# Patient Record
Sex: Female | Born: 1955 | Race: White | Hispanic: No | Marital: Married | Smoking: Never smoker
Health system: Southern US, Community
[De-identification: ages and names within clinical notes are randomized; demographics above are authoritative.]

## PROBLEM LIST (undated history)

## (undated) DIAGNOSIS — T8859XA Other complications of anesthesia, initial encounter: Secondary | ICD-10-CM

## (undated) DIAGNOSIS — J45909 Unspecified asthma, uncomplicated: Secondary | ICD-10-CM

## (undated) DIAGNOSIS — T4145XA Adverse effect of unspecified anesthetic, initial encounter: Secondary | ICD-10-CM

## (undated) DIAGNOSIS — Z780 Asymptomatic menopausal state: Secondary | ICD-10-CM

## (undated) DIAGNOSIS — R6882 Decreased libido: Secondary | ICD-10-CM

## (undated) HISTORY — PX: KNEE ARTHROSCOPY: SHX127

## (undated) HISTORY — DX: Asymptomatic menopausal state: Z78.0

## (undated) HISTORY — DX: Decreased libido: R68.82

---
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
## 1987-06-19 HISTORY — PX: APPENDECTOMY: SHX54

## 2001-06-18 HISTORY — PX: BREAST BIOPSY: SHX20

## 2004-06-21 ENCOUNTER — Ambulatory Visit: Payer: Self-pay | Admitting: Internal Medicine

## 2006-03-08 ENCOUNTER — Ambulatory Visit: Payer: Self-pay

## 2007-10-20 ENCOUNTER — Ambulatory Visit: Payer: Self-pay | Admitting: Obstetrics and Gynecology

## 2007-10-27 ENCOUNTER — Other Ambulatory Visit: Payer: Self-pay

## 2007-10-27 ENCOUNTER — Ambulatory Visit: Payer: Self-pay | Admitting: Obstetrics and Gynecology

## 2008-06-22 ENCOUNTER — Ambulatory Visit: Payer: Self-pay

## 2009-09-28 ENCOUNTER — Ambulatory Visit: Payer: Self-pay

## 2010-10-02 ENCOUNTER — Ambulatory Visit: Payer: Self-pay

## 2011-03-23 ENCOUNTER — Ambulatory Visit: Payer: Self-pay | Admitting: General Practice

## 2012-01-22 ENCOUNTER — Ambulatory Visit: Payer: Self-pay | Admitting: Internal Medicine

## 2012-06-27 ENCOUNTER — Other Ambulatory Visit: Payer: Self-pay | Admitting: Obstetrics and Gynecology

## 2014-09-20 DIAGNOSIS — J45990 Exercise induced bronchospasm: Secondary | ICD-10-CM | POA: Insufficient documentation

## 2014-09-20 DIAGNOSIS — E663 Overweight: Secondary | ICD-10-CM

## 2014-09-22 ENCOUNTER — Encounter: Payer: Self-pay | Admitting: *Deleted

## 2015-02-25 ENCOUNTER — Telehealth: Payer: Self-pay | Admitting: Obstetrics and Gynecology

## 2015-02-25 NOTE — Telephone Encounter (Signed)
PT CALLED AND WOULD LIKE ANOTHER 3 MONTH REFILL ON HER B12 AND PHENTERMINE SENT TO [facility] ON [address].

## 2015-02-25 NOTE — Telephone Encounter (Signed)
DONE

## 2015-02-25 NOTE — Telephone Encounter (Signed)
Traci Drake would you please call pt and set up appt for refill thanks

## 2015-03-08 ENCOUNTER — Encounter: Payer: Self-pay | Admitting: Obstetrics and Gynecology

## 2015-03-08 ENCOUNTER — Ambulatory Visit (INDEPENDENT_AMBULATORY_CARE_PROVIDER_SITE_OTHER): Admitting: Obstetrics and Gynecology

## 2015-03-08 VITALS — BP 129/83 | HR 96 | Ht 66.0 in | Wt 145.0 lb

## 2015-03-08 DIAGNOSIS — Z79899 Other long term (current) drug therapy: Secondary | ICD-10-CM

## 2015-03-08 NOTE — Progress Notes (Signed)
Subjective:     Patient ID: SHAWNDA MAUNEY, female   DOB: July 18, 1955, 59 y.o.   MRN: [id number]  HPI Has been taking adipex and B12 for weight loss, down 26# and happy with current weight, BMI now 23.5  Review of Systems No complaints    Objective:   Physical Exam A&O x4 Well groomed female in no distress    Assessment:     Weight loss successful, normal BMI     Plan:     RX given for PRN use  RTC prn  Yolanda Bonine, CNM

## 2015-06-08 ENCOUNTER — Ambulatory Visit: Admitting: Obstetrics and Gynecology

## 2015-09-18 ENCOUNTER — Other Ambulatory Visit: Payer: Self-pay | Admitting: Obstetrics and Gynecology

## 2015-11-16 ENCOUNTER — Other Ambulatory Visit: Payer: Self-pay | Admitting: Obstetrics and Gynecology

## 2015-11-16 MED ORDER — CYANOCOBALAMIN 1000 MCG/ML IJ SOLN: 1000.0000 ug | Freq: Once | INTRAMUSCULAR | Status: DC

## 2016-06-19 ENCOUNTER — Ambulatory Visit: Payer: Self-pay | Admitting: Physician Assistant

## 2016-06-19 ENCOUNTER — Encounter: Payer: Self-pay | Admitting: Physician Assistant

## 2016-06-19 VITALS — BP 130/80 | HR 100 | Temp 98.8°F

## 2016-06-19 DIAGNOSIS — J0100 Acute maxillary sinusitis, unspecified: Secondary | ICD-10-CM

## 2016-06-19 MED ORDER — AMOXICILLIN 875 MG PO TABS: 875.0000 mg | ORAL_TABLET | Freq: Two times a day (BID) | ORAL | 0 refills | Status: DC

## 2016-06-19 MED ORDER — PREDNISONE 10 MG PO TABS: 30.0000 mg | ORAL_TABLET | Freq: Every day | ORAL | 0 refills | Status: DC

## 2016-06-19 NOTE — Progress Notes (Signed)
S: C/o headache and congestion for 6 days, no fever, chills, cp/sob, v/d; mucus is green and thick,  c/o of facial pressure  Using otc meds:   O: PE: vitals wnl, nad, perrl eomi, normocephalic, tms dull, nasal mucosa red and swollen, throat injected, neck supple no lymph, lungs c t a, cv rrr, neuro intact  A:  Acute sinusitis   P: drink fluids, continue regular meds , use otc meds of choice, return if not improving in 5 days, return earlier if worsening , amoxil, prednisone 30 mg x 3d

## 2016-11-16 HISTORY — PX: KNEE ARTHROPLASTY: SHX992

## 2016-12-06 ENCOUNTER — Ambulatory Visit: Attending: Orthopedic Surgery

## 2016-12-06 DIAGNOSIS — M25561 Pain in right knee: Secondary | ICD-10-CM | POA: Insufficient documentation

## 2016-12-06 DIAGNOSIS — M25661 Stiffness of right knee, not elsewhere classified: Secondary | ICD-10-CM | POA: Diagnosis present

## 2016-12-06 DIAGNOSIS — M6281 Muscle weakness (generalized): Secondary | ICD-10-CM | POA: Diagnosis present

## 2016-12-06 NOTE — Therapy (Signed)
[facility] PH[facility] AND SPORTS MEDICINE [address] [place], [place], [address] Phone: [phone]   Fax:  [phone]  Physical Therapy Evaluation  Patient Details  Name: Traci Drake MRN: [id number] Date of Birth: Mar 31, 1956 Referring Provider: [name]  Encounter Date: 12/06/2016      PT End of Session - 12/06/16 0809    Visit Number 1   Number of Visits 13   Date for PT Re-Evaluation 01/17/17   Authorization Type no g codes   PT Start Time 0810   PT Stop Time 0910   PT Time Calculation (min) 60 min   Activity Tolerance Patient tolerated treatment well   Behavior [facility] Therapy [facility] for tasks assessed/performed      Past Medical History:  Diagnosis Date  . Decreased libido without sexual dysfunction   . Menopause     Past Surgical History:  Procedure Laterality Date  . APPENDECTOMY  1989  . BREAST BIOPSY  2003    There were no vitals filed for this visit.       Subjective Assessment - 12/06/16 0805    Subjective R partial knee replacement   Pertinent History Pt underwent medial R partial [facility] with Dr. [name]. No post-op complications. She started PT POD#1. Staples removed yesterday at her 2 week follow-up appointment by a resident. Pt had HH PT for the last 2 weeks (3d/wk). Per pt they worked on stretching, walking, and strengthening exercises. Last range of motion measure was 94 degrees flexion, unable to recall extension but was told that it was "good". Next follow-up appointment is 01/02/17 with MD. Pt reports she had increased pain/stiffness last night and increased stiffness this morning. Denies chills, fever, night sweats, nausea, and vomiting. Swelling has been gradually decreasing in RLE.   Limitations Walking   How long can you walk comfortably? 45 minutes   Diagnostic tests Serial radiographs, no problems with components as reported by patient   Patient Stated Goals Decrease  pain and improve function. Get back to working in the yard   Currently in Pain? Yes   Pain Score 5   Worst: 9/10; Best: 3/10   Pain Location Knee   Pain Orientation Right   Pain Descriptors / Indicators Aching;Shooting   Pain Type Surgical pain   Pain Radiating Towards None   Pain Onset 1 to 4 weeks ago   Pain Frequency Constant   Aggravating Factors  Extended standing, extended walking, worsens as day progresses, struggles to get comfortable in bed   Pain Relieving Factors seated with foot propped up, ice, meds, rest   Multiple Pain Sites No            [facility] PT Assessment - 12/06/16 0001      Assessment   Medical Diagnosis R partial knee replacement   Referring Provider [name]   Onset Date/Surgical Date 11/20/16   Hand Dominance Right   Next MD Visit 01/02/17   Prior Therapy Home health PT 3d/wk     Precautions   Precautions Knee     Restrictions   Weight Bearing Restrictions No     Balance Screen   Has the patient fallen in the past 6 months No   Has the patient had a decrease in activity level because of a fear of falling?  Yes   Is the patient reluctant to leave their home because of a fear of falling?  No     Home Environment  Living Environment Private residence   Living Arrangements Spouse/significant other   Available Help at Discharge Family   Type of Home House   Home Access Stairs to enter   Entrance Stairs-Number of Steps 2   Entrance Stairs-Rails Left   Home Layout Multi-level;Able to live on main level with bedroom/bathroom   Alternate Level Stairs-Number of Steps 15   Alternate Level Stairs-Rails Right   Home Equipment Walker - 2 wheels;Cane - single point;Bedside commode   Additional Comments Has been living in guest room on main level     Prior Function   Level of Independence Independent   Vocation Part time employment   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 Otherwise no N/T in bilatearl LE     Posture/Postural Control   Posture Comments WNL     ROM / Strength   AROM / PROM / Strength AROM;PROM;Strength     AROM   Overall AROM Comments Hamstring length around 90 degrees bilaterally   AROM Assessment Site Knee   Right/Left Knee Right;Left   Right Knee Extension -2   Right Knee Flexion 98   Left Knee Extension 0   Left Knee Flexion 135     PROM   PROM Assessment Site Knee   Right/Left Knee Right   Right Knee Extension -2  Unable to tolerate added pressure   Right Knee Flexion 104  Pain limited   Left Knee Extension 0   Left Knee Flexion 135     Strength   Overall Strength Comments Pt struggles to achieve good quad contraction with quad sets on RLE   Strength Assessment Site Hip;Knee;Ankle   Right/Left Hip Right;Left   Right Hip Flexion 5/5   Right Hip Extension 4/5   Right Hip External Rotation  5/5   Right Hip Internal Rotation 5/5   Right Hip ABduction 4+/5   Right Hip ADduction 4-/5   Left Hip Flexion 5/5   Left Hip Extension 4/5   Left Hip External Rotation 5/5   Left Hip Internal Rotation 5/5   Left Hip ABduction 4+/5   Left Hip ADduction 4+/5   Right/Left Knee Right;Left   Right Knee Flexion 5/5   Right Knee Extension 4+/5  Unable to apply full force secondary to pain   Left Knee Flexion 5/5   Left Knee Extension 5/5   Right/Left Ankle Right;Left   Right Ankle Dorsiflexion 5/5   Right Ankle Plantar Flexion --  Strong active contraction   Left Ankle Dorsiflexion 5/5   Left Ankle Plantar Flexion --  Strong active contraction     Palpation   Palpation comment mild tenderness to palpation to medial and lateral knee, Pain is more significant medially and  extends more proximal up thigh     Transfers   Comments Able to transfer without UE support from elevated mat table. Decreased weight shift to RLE     Ambulation/Gait   Gait Comments Antalgic gait on the right with decreased stance time on RLE and decreased R knee flexion during swing. Ambulates with spc in RUE     Balance   Balance Assessed No  Full balance screening  deferred         TREATMENT  Ther-ex Quad sets in supine x 5, poor contraction noted; Standing quad rocking x 5, continued poor quad contraction; Standing marches x 10 bilateral; Standing hip abduction x 10, with YTB x 10; Standing mini squats x 10, cues for symmetrical WB as pt favoring LLE; Standing heel raises x 10, cues to gradually favor RLE in order to progress to single leg heel raise; Reviewed seated and supine knee flexion stretch with patient;    Objective measurements completed on examination: See above findings.                       PT Education - 12/06/16 0809    Education provided Yes   Education Details Plan of care and HEP   Person(s) Educated Patient   Methods Explanation;Demonstration;Handout   Comprehension Verbalized understanding;Returned demonstration             PT Long Term Goals - 12/06/16 0934      PT LONG TERM GOAL #1   Title Pt will be independent with HEP in order to improve strength and range of motion in order to decrease pain and improve function at home, work, and with leisure activities such as gardening.   Time 6   Status New     PT LONG TERM GOAL #2   Title Pt will increase LEFS by at least 9 points in order to demonstrate significant improvement in lower extremity function.     Baseline 12/06/16: 26/80   Time 6   Period Weeks   Status New     PT LONG TERM GOAL #3   Title Pt will improve R knee active range of motion for extension to 0 degrees and flexion to at least 120 degrees in order to normalize gait pattern and improve ability to  easily ascend/descend stairs   Baseline 12/06/16: AROM flexion 98, extension -2   Time 6   Period Weeks   Status New     PT LONG TERM GOAL #4   Title Pt will be able to walk/stand for at least 90 minutes in order to return to work as Charity fundraiser and resume working out in the yard   Baseline 12/06/16: 45 minutes   Time 6   Period Weeks   Status New                Plan - 12/06/16 4097    Clinical Impression Statement Pt is a pleasant 61 yo female who underwent medial R partial knee replacement 11/20/16 [facility] with Dr. Lanell Matar. PT evaluation reveals good progress with ROM and strength. Her AROM is -2 to 98 degrees and AAROM is -2 to 104 with primary limitation being pain. R knee flexion/extension is strong however pt does struggle to demonstrate a proficient quad contraction in supine or standing. Excellent hip strength. Her LEFS is 26/80 indicating significant limitation in function related to her knee replacement. She will benefit from skilled PT services to address deficits in R knee pain, ROM, and strength in order to return to full function at home, work, and with leisure activities such as gardening.     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   Clinical Presentation Stable   Clinical Presentation due to: pain is localized in knee, good progress since surgery with improving pain, swelling, and range   Clinical Decision Making Low  Rehab Potential Excellent   Clinical Impairments Affecting Rehab Potential Positive: motivation, age, partial replacement, active; Negative: history of L knee problems   PT Frequency 2x / week   PT Duration 6 weeks   PT Treatment/Interventions Aquatic Therapy;ADLs/Self Care Home Management;Cryotherapy;Electrical Stimulation;Iontophoresis 4mg /ml Dexamethasone;Moist Heat;Ultrasound;DME Instruction;Gait training;Stair training;Functional mobility training;Therapeutic  activities;Therapeutic exercise;Balance training;Neuromuscular re-education;Patient/family education;Manual techniques;Scar mobilization   PT Next Visit Plan Manual techniques as appropriate, strengthening for knee, consider estim due to poor quad firing with quad set   PT Home Exercise Plan quad sets, standing knee rocking for quad contraction, standing marching, standing hip abduction with band, standing mini squats, standing heel raises, seated or supine knee flexion stretching per patient preference   Consulted and Agree with Plan of Care Patient      Patient will benefit from skilled therapeutic intervention in order to improve the following deficits and impairments:  Abnormal gait, Decreased balance, Decreased strength, Decreased range of motion, Pain  Visit Diagnosis: Acute pain of right knee - Plan: PT plan of care cert/re-cert  Muscle weakness (generalized) - Plan: PT plan of care cert/re-cert  Stiffness of right knee, not elsewhere classified - Plan: PT plan of care cert/re-cert     Problem List Patient Active Problem List   Diagnosis Date Noted  . Asthma, exercise induced 09/20/2014  . Overweight 09/20/2014   Lynnea Maizes PT, DPT   Dwayne Begay 12/06/2016, 9:45 AM  [facility] PHYSICAL AND SPORTS MEDICINE [address], [place], [address] Phone: [phone]   Fax:  [phone]  Name: Traci Drake MRN: [id number] Date of Birth: 1955-11-16

## 2016-12-10 ENCOUNTER — Encounter: Payer: Self-pay | Admitting: Physical Therapy

## 2016-12-10 ENCOUNTER — Ambulatory Visit: Admitting: Physical Therapy

## 2016-12-10 DIAGNOSIS — M25561 Pain in right knee: Secondary | ICD-10-CM

## 2016-12-10 DIAGNOSIS — M25661 Stiffness of right knee, not elsewhere classified: Secondary | ICD-10-CM

## 2016-12-10 DIAGNOSIS — M6281 Muscle weakness (generalized): Secondary | ICD-10-CM

## 2016-12-10 NOTE — Therapy (Signed)
[place] [facility]IC[facility] [address] [place], [place], [address] Phone: [phone]   Fax:  [phone]  Physical Therapy Treatment  Patient Details  Name: Traci Drake MRN: [id number] Date of Birth: 1955/08/23 Referring Provider: [name]  Encounter Date: 12/10/2016      PT End of Session - 12/10/16 1030    Visit Number 2   Number of Visits 13   Date for PT Re-Evaluation 01/17/17   Authorization Type no g codes   PT Start Time 1031   PT Stop Time 1113   PT Time Calculation (min) 42 min   Activity Tolerance Patient tolerated treatment well   Behavior During Therapy [facility] for tasks assessed/performed      Past Medical History:  Diagnosis Date  . Decreased libido without sexual dysfunction   . Menopause     Past Surgical History:  Procedure Laterality Date  . APPENDECTOMY  1989  . BREAST BIOPSY  2003    There were no vitals filed for this visit.      Subjective Assessment - 12/10/16 1034    Subjective Pt reports she occasionally has an electric shock on medial R knee region if "I move the wrong way".  She reports her knee is feeling stiff this morning.  Has not been able to sleep through the night due to pain.     Pertinent History Pt underwent medial R partial [facility] with Dr. [name]. No post-op complications. She started PT POD#1. Staples removed yesterday at her 2 week follow-up appointment by a resident. Pt had HH PT for the last 2 weeks (3d/wk). Per pt they worked on stretching, walking, and strengthening exercises. Last range of motion measure was 94 degrees flexion, unable to recall extension but was told that it was "good". Next follow-up appointment is 01/02/17 with MD. Pt reports she had increased pain/stiffness last night and increased stiffness this morning. Denies chills, fever, night sweats, nausea, and vomiting. Swelling has been gradually decreasing in RLE.   Limitations  Walking   How long can you walk comfortably? 45 minutes   Diagnostic tests Serial radiographs, no problems with components as reported by patient   Patient Stated Goals Decrease pain and improve function. Get back to working in the yard   Currently in Pain? Yes   Pain Score 4    Pain Location Knee   Pain Orientation Right   Pain Descriptors / Indicators Aching   Pain Type Surgical pain   Pain Onset 1 to 4 weeks ago   Pain Frequency Intermittent   Multiple Pain Sites No      TREATMENT   R knee AROM (in deg):  F: 102  E: -4    Ther-ex: R quad sets in supine x10 with 5 second holds with towel roll under R ankle   Desensitization technique with towel rub to medial R knee x2 minutes (added to HEP) in an effort to address "electric shock" episodes   R LAQ 2x10 with 5 second holds, ~10 deg extension lag noted   Backward walking with SPC to encourage R knee extension when ambulating. 2x30 ft with min guard assist for safety.   R TKE with RTB 2x10 with demonstration for proper technique   Standing quad rocking with R foot forward and then with R foot back x2 minutes each   R foot on 2nd step forward lunging to promote R knee flexion. X10 with BUE support on railings  Lateral mini lunges with UE support x10 to the R     Manual Therapy:  STM R quad, specifically to VL due to noted increased muscular tension and trigger points. x8 minutes. Pt reports "that's the most relief I have felt since surgery".   Instructed pt to ice R knee when she returns home after session.             PT Education - 12/10/16 1029    Education provided Yes   Education Details Exercise technique; clinical reasoning behind desensitization technique   Person(s) Educated Patient   Methods Explanation;Demonstration;Verbal cues   Comprehension Returned demonstration;Verbalized understanding;Verbal cues required;Need further instruction             PT Long Term Goals - 12/06/16 0934       PT LONG TERM GOAL #1   Title Pt will be independent with HEP in order to improve strength and range of motion in order to decrease pain and improve function at home, work, and with leisure activities such as gardening.   Time 6   Status New     PT LONG TERM GOAL #2   Title Pt will increase LEFS by at least 9 points in order to demonstrate significant improvement in lower extremity function.     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  She reported that she has an occasional "electric shock" feeling in R medial knee and responded well to introduction of desensitization technique and extremely well to STM R quad in an effort to decrease these episodes.  Pt will benefit from continued skilled PT interventions for improved strength, ROM, and functional use of RLE.    Rehab Potential Excellent   Clinical Impairments Affecting Rehab Potential Positive: motivation, age, partial replacement, active; Negative: history of L knee problems   PT Frequency 2x / week   PT Duration 6 weeks   PT Treatment/Interventions Aquatic Therapy;ADLs/Self Care Home Management;Cryotherapy;Electrical Stimulation;Iontophoresis 4mg /ml  Dexamethasone;Moist Heat;Ultrasound;DME Instruction;Gait training;Stair training;Functional mobility training;Therapeutic activities;Therapeutic exercise;Balance training;Neuromuscular re-education;Patient/family education;Manual techniques;Scar mobilization   PT Next Visit Plan Manual techniques as appropriate, strengthening for knee, consider estim due to poor quad firing with quad set   PT Home Exercise Plan quad sets, standing knee rocking for quad contraction, standing marching, standing hip abduction with band, standing mini squats, standing heel raises, seated or supine knee flexion stretching per patient preference   Consulted and Agree with Plan of Care Patient      Patient  will benefit from skilled therapeutic intervention in order to improve the following deficits and impairments:  Abnormal gait, Decreased balance, Decreased strength, Decreased range of motion, Pain  Visit Diagnosis: Acute pain of right knee  Muscle weakness (generalized)  Stiffness of right knee, not elsewhere classified     Problem List Patient Active Problem List   Diagnosis Date Noted  . Asthma, exercise induced 09/20/2014  . Overweight 09/20/2014    Encarnacion Chu PT, DPT 12/10/2016, 11:27 AM  [facility] [facility] PHYSICAL AND SPORTS MEDICINE [address] [place], [address] Phone: [phone]   Fax:  [phone]  Name: Traci Drake MRN: [id number] Date of Birth: 1956/04/20

## 2016-12-13 ENCOUNTER — Ambulatory Visit: Admitting: Physical Therapy

## 2016-12-13 DIAGNOSIS — M25561 Pain in right knee: Secondary | ICD-10-CM

## 2016-12-13 DIAGNOSIS — M25661 Stiffness of right knee, not elsewhere classified: Secondary | ICD-10-CM

## 2016-12-13 DIAGNOSIS — M6281 Muscle weakness (generalized): Secondary | ICD-10-CM

## 2016-12-13 NOTE — Patient Instructions (Signed)
High volt stim at 85V to distal medial and lateral quadricep with moist hot pack   0-107 ROM on RLE

## 2016-12-13 NOTE — Therapy (Addendum)
[facility] PHYSICAL AND SPORTS MEDICINE [address], [place], [address] Phone: [phone]   Fax:  [phone]  Physical Therapy Treatment  Patient Details  Name: Traci Drake MRN: [id number] Date of Birth: 23-Dec-1955 Referring Provider: Dr. Melina Fiddler  Encounter Date: 12/13/2016      PT End of Session - 12/13/16 1250    Visit Number 3   Number of Visits 13   Date for PT Re-Evaluation 01/17/17   Authorization Type no g codes   PT Start Time 0950   PT Stop Time 1030   PT Time Calculation (min) 40 min   Activity Tolerance Patient tolerated treatment well   Behavior During Therapy [facility] for tasks assessed/performed      Past Medical History:  Diagnosis Date  . Decreased libido without sexual dysfunction   . Menopause     Past Surgical History:  Procedure Laterality Date  . APPENDECTOMY  1989  . BREAST BIOPSY  2003    There were no vitals filed for this visit.      Subjective Assessment - 12/13/16 0951    Subjective Patient is a labor and delivery nurse, had a R uni-compartmental medial R knee replacement. She reports that her pain has been worst at night, stiffness in the morning.    Pertinent History Pt underwent medial R partial knee replacement 11/20/16 at [facility] with Dr. Lanell Matar. No post-op complications. She started PT POD#1. Staples removed yesterday at her 2 week follow-up appointment by a resident. Pt had HH PT for the last 2 weeks (3d/wk). Per pt they worked on stretching, walking, and strengthening exercises. Last range of motion measure was 94 degrees flexion, unable to recall extension but was told that it was "good". Next follow-up appointment is 01/02/17 with MD. Pt reports she had increased pain/stiffness last night and increased stiffness this morning. Denies chills, fever, night sweats, nausea, and vomiting. Swelling has been gradually decreasing in RLE.   Limitations Walking   How long can you walk  comfortably? 45 minutes   Diagnostic tests Serial radiographs, no problems with components as reported by patient   Patient Stated Goals Decrease pain and improve function. Get back to working in the yard   Currently in Pain? Yes   Pain Score 3    Pain Location Knee   Pain Orientation Right   Pain Descriptors / Indicators Aching   Pain Type Surgical pain   Pain Onset 1 to 4 weeks ago      High volt stim at 85V to distal medial and lateral quadricep with moist hot pack x 10 minutes for soft tissue pain relief to allow improved quadricep contraction   0-107 ROM on RLE  Step ups to 1 block, 2 blocks x 10 repetitions with use of HHA bilaterally x 2 sets (well tolerated, good quad output noted for this stage of rehab)   Total gym squats (level 20) -- 3 sets x 10 repetitions with bilateral LEs (painful, but tolerable, appropriately challenging)   Soft tissue mobilization to medial knee compartment soft tissue -- as this was area of pain chiefly reported by patient. Reports significant decrease in symptoms to complete session.                            PT Education - 12/13/16 1249    Education provided Yes   Education Details Perform squats and step ups during the week, continue with pain  control modalities.    Person(s) Educated Patient   Methods Explanation;Demonstration   Comprehension Returned demonstration;Verbalized understanding             PT Long Term Goals - 12/06/16 0934      PT LONG TERM GOAL #1   Title Pt will be independent with HEP in order to improve strength and range of motion in order to decrease pain and improve function at home, work, and with leisure activities such as gardening.   Time 6   Status New     PT LONG TERM GOAL #2   Title Pt will increase LEFS by at least 9 points in order to demonstrate significant improvement in lower extremity function.     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 She has excellent flexion ROM this date as well, though continue to require quadricep strengthening to optimize function.    Clinical Presentation Stable   Clinical Decision Making Low   Rehab Potential Excellent   Clinical Impairments Affecting Rehab Potential Positive: motivation, age, partial replacement, active; Negative: history of L knee problems   PT Frequency 2x / week   PT Duration 6 weeks   PT Treatment/Interventions Aquatic Therapy;ADLs/Self Care Home Management;Cryotherapy;Electrical Stimulation;Iontophoresis 4mg /ml Dexamethasone;Moist Heat;Ultrasound;DME Instruction;Gait training;Stair training;Functional mobility training;Therapeutic activities;Therapeutic exercise;Balance training;Neuromuscular re-education;Patient/family education;Manual techniques;Scar mobilization   PT Next Visit Plan Manual techniques as appropriate, strengthening for knee, consider estim due to poor quad firing with quad set   PT Home Exercise Plan quad sets, standing knee rocking for quad contraction, standing marching, standing hip abduction with band, standing mini squats, standing heel raises, seated or supine knee flexion stretching per patient preference    Consulted and Agree with Plan of Care Patient      Patient will benefit from skilled therapeutic intervention in order to improve the following deficits and impairments:  Abnormal gait, Decreased balance, Decreased strength, Decreased range of motion, Pain  Visit Diagnosis: Acute pain of right knee  Muscle weakness (generalized)  Stiffness of right knee, not elsewhere classified     Problem List Patient Active Problem List   Diagnosis Date Noted  . Asthma, exercise induced 09/20/2014  . Overweight 09/20/2014   Alva Garnet PT, DPT, CSCS    12/13/2016, 12:55 PM   Late addendum, added comments to exercises 12/17/2016  Alva Garnet PT, DPT, CSCS    [facility] PHYSICAL AND SPORTS MEDICINE [address] [place], [place],  [id number] Phone: [phone]   Fax:  [phone]  Name: Traci Drake MRN: [id number] Date of Birth: [birth year]

## 2016-12-17 ENCOUNTER — Ambulatory Visit: Admitting: Physical Therapy

## 2016-12-20 ENCOUNTER — Ambulatory Visit: Admitting: Physical Therapy

## 2016-12-24 ENCOUNTER — Ambulatory Visit: Admitting: Physical Therapy

## 2016-12-26 ENCOUNTER — Ambulatory Visit: Attending: Orthopedic Surgery | Admitting: Physical Therapy

## 2016-12-26 DIAGNOSIS — M25561 Pain in right knee: Secondary | ICD-10-CM | POA: Insufficient documentation

## 2016-12-26 DIAGNOSIS — M6281 Muscle weakness (generalized): Secondary | ICD-10-CM

## 2016-12-26 DIAGNOSIS — M25661 Stiffness of right knee, not elsewhere classified: Secondary | ICD-10-CM | POA: Diagnosis present

## 2016-12-26 NOTE — Patient Instructions (Signed)
Bike  AROM 1-121  High volt stim at 120 V   Cross-friction massage   SLR- no quad lag   Gait assessment

## 2016-12-26 NOTE — Therapy (Signed)
[facility] PHYSICAL AND SPORTS MEDICINE [address] [place], [place], [address] Phone: [phone]   Fax:  [phone]  Physical Therapy Treatment  Patient Details  Name: Traci KernCatherine H Colombe MRN: [id number] Date of Birth: 05/13/1956 Referring Provider: [name]  Encounter Date: 12/26/2016      PT End of Session - 12/26/16 1523    Visit Number 4   Number of Visits 13   Date for PT Re-Evaluation 01/17/17   Authorization Type no g codes   PT Start Time 1030   PT Stop Time 1115   PT Time Calculation (min) 45 min   Activity Tolerance Patient tolerated treatment well   Behavior [facility] tasks assessed/performed      Past Medical History:  Diagnosis Date  . Decreased libido without sexual dysfunction   . Menopause     Past Surgical History:  Procedure Laterality Date  . APPENDECTOMY  1989  . BREAST BIOPSY  2003    There were no vitals filed for this visit.      Subjective Assessment - 12/26/16 1106    Subjective Patient [place] she continues to get a very tight knot around the superior aspect of her incision, her pain generally becomes quite intense at 9PM leaving her pacing at night. She has weaned off a an AD.    Pertinent History Pt underwent medial R partial [facility] with Dr. [name]. No post-op complications. She started PT POD#1. Staples removed yesterday at her 2 week follow-up appointment by a resident. Pt had HH PT for the last 2 weeks (3d/wk). Per pt they worked on stretching, walking, and strengthening exercises. Last range of motion measure was 94 degrees flexion, unable to recall extension but was told that it was "good". Next follow-up appointment is 01/02/17 with MD. Pt reports she had increased pain/stiffness last night and increased stiffness this morning. Denies chills, fever, night sweats, nausea, and vomiting. Swelling has  been gradually decreasing in RLE.   Limitations Walking   How long can you walk comfortably? 45 minutes   Diagnostic tests Serial radiographs, no problems with components as reported by patient   Patient Stated Goals Decrease pain and improve function. Get back to working in the yard   Currently in Pain? Yes  Does not rate pain but notes it to be mild to moderate around supero-medial portion of knee.   Pain Location Knee   Pain Orientation Right   Pain Descriptors / Indicators Aching;Tightness   Pain Type Surgical pain   Pain Onset 1 to 4 weeks ago   Pain Frequency Constant      Bike (adjusted on stationary to meet her ROM for complete revolution x3 minutes to facilitate knee flexion and quad stretch shortening cycle)  AROM 1-121  High volt stim at 120 V x 15 minutes on middle and medial thigh   Cross-friction massage on medial retinaculum with concurrent High volt stim being provided   SLR- no quad lag on 10 conesecutive attempts   Gait assessment -- notable for slight functional quadricep lag, she has excessive knee flexion throughout gait cycle, but has passive knee extension indicative of functional quadricep weakness.   Performed soft tissue mobilization primarily over the medial quadriceps and adductors with multiple trigger/tender points of taut painful bands identified. She reports decrease in pain evoked with prolonged bout of STM, discussed option of TDN with patient.  PT Education - 12/26/16 1523    Education provided Yes   Education Details Inform therapist of findings with MD, appears to be more soft tissue related pain.    Person(s) Educated Patient   Methods Explanation   Comprehension Verbalized understanding             PT Long Term Goals - 12/06/16 0934      PT LONG TERM GOAL #1   Title Pt will be independent with HEP in order to improve strength and range of motion in order to decrease pain and improve  function at home, work, and with leisure activities such as gardening.   Time 6   Status New     PT LONG TERM GOAL #2   Title Pt will increase LEFS by at least 9 points in order to demonstrate significant improvement in lower extremity function.     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 She continues to have high pain levels at the end of the day, though this is likely related to quadriceps strength deficits and soft tissue irritability that persists. She will be seeing her MD later today for follow up and inform therapist of any concerns at next scheduled appointment.    Clinical Presentation Stable   Clinical Decision Making Moderate   Rehab Potential Excellent   Clinical Impairments Affecting Rehab Potential Positive: motivation, age, partial replacement, active; Negative: history of L knee problems   PT Frequency 2x / week   PT Duration 6 weeks   PT Treatment/Interventions Aquatic Therapy;ADLs/Self Care Home Management;Cryotherapy;Electrical Stimulation;Iontophoresis 4mg /ml Dexamethasone;Moist Heat;Ultrasound;DME Instruction;Gait training;Stair training;Functional mobility training;Therapeutic activities;Therapeutic exercise;Balance training;Neuromuscular  re-education;Patient/family education;Manual techniques;Scar mobilization   PT Next Visit Plan Manual techniques as appropriate, strengthening for knee, consider estim due to poor quad firing with quad set   PT Home Exercise Plan quad sets, standing knee rocking for quad contraction, standing marching, standing hip abduction with band, standing mini squats, standing heel raises, seated or supine knee flexion stretching per patient preference   Consulted and Agree with Plan of Care Patient      Patient will benefit from skilled therapeutic intervention in order to improve the following deficits and impairments:  Abnormal gait, Decreased balance, Decreased strength, Decreased range of motion, Pain  Visit Diagnosis: Acute pain of right knee  Muscle weakness (generalized)  Stiffness of right knee, not elsewhere classified     Problem List Patient Active Problem List   Diagnosis Date Noted  . Asthma, exercise induced 09/20/2014  . Overweight 09/20/2014  Alva Garnet PT, DPT, CSCS    12/26/2016, 3:25 PM  [facility] PHYSICAL AND SPORTS MEDICINE [address] [place], [address] Phone: [phone]   Fax:  [phone]  Name: KENSLEE ACHORN MRN: [id number] Date of Birth: 12-23-1955

## 2016-12-27 ENCOUNTER — Ambulatory Visit: Admitting: Physical Therapy

## 2016-12-27 DIAGNOSIS — M25561 Pain in right knee: Secondary | ICD-10-CM | POA: Diagnosis not present

## 2016-12-27 DIAGNOSIS — M6281 Muscle weakness (generalized): Secondary | ICD-10-CM

## 2016-12-27 DIAGNOSIS — M25661 Stiffness of right knee, not elsewhere classified: Secondary | ICD-10-CM

## 2016-12-27 NOTE — Therapy (Signed)
[facility] PHYSICAL AND SPORTS MEDICINE [address] [place], [place], [address] Phone: [phone]   Fax:  [phone]  Physical Therapy Treatment  Patient Details  Name: Traci Drake MRN: [id number] Date of Birth: September 21, 1955 Referring Provider: [name]  Encounter Date: 12/27/2016      PT End of Session - 12/27/16 1533    Visit Number 5   Number of Visits 13   Date for PT Re-Evaluation 01/17/17   Authorization Type no g codes   PT Start Time 0945   PT Stop Time 1030   PT Time Calculation (min) 45 min   Activity Tolerance Patient tolerated treatment well   Behavior [facility] Therapy [facility] for tasks assessed/performed      Past Medical History:  Diagnosis Date  . Decreased libido without sexual dysfunction   . Menopause     Past Surgical History:  Procedure Laterality Date  . APPENDECTOMY  1989  . BREAST BIOPSY  2003    There were no vitals filed for this visit.      Subjective Assessment - 12/27/16 1531    Subjective Patient reports her MD was happy with alignment on X-rays, will follow up in 6-10 months. She continues to have severe pain at night, per MD it is likely retinacular tightness.    Pertinent History Pt [facility] medial R partial [facility] Dr. [name]. No post-op complications. She started PT POD#1. Staples removed yesterday at her 2 week follow-up appointment by a resident. Pt had HH PT for the last 2 weeks (3d/wk). Per pt they worked on stretching, walking, and strengthening exercises. Last range of motion measure was 94 degrees flexion, unable to recall extension but was told that it was "good". Next follow-up appointment is 01/02/17 with MD. Pt reports she had increased pain/stiffness last night and increased stiffness this morning. Denies chills, fever, night sweats, nausea, and vomiting. Swelling has been gradually decreasing in RLE.   Limitations Walking   How long can you walk  comfortably? 45 minutes   Diagnostic tests Serial radiographs, no problems with components as reported by patient   Patient Stated Goals Decrease pain and improve function. Get back to working in the yard   Currently in Pain? Yes  Reports as tolerable, over medial quadriceps/adductors distally   Pain Descriptors / Indicators Aching;Tightness   Pain Type Surgical pain   Pain Onset 1 to 4 weeks ago   Pain Frequency Intermittent      Leg press 45# x 10, 55#x 10 for 2 sets   BOSU hip abduction x 10 x 2 sets   Standing hip abduction on MATRIX hip x 10 for 2 sets   Total gym lvl 17 x 12 for 3 sets with bilateral LE   Provided soft tissue mobilization to distal lateral quadricep as well as high volt stimulation to mid belly of quadriceps x 10 minutes at 120V , well tolerated with reported decrease in symptoms after completion, educated to continue with soft tissue work at home and potential use of dry needling in follow up sessions.                            PT Education - 12/27/16 1532    Education provided Yes   Education Details Soreness is likely from quad weakness, will begin to focus on strengthening and manage pain as best as possible to provide longer term relief.  Person(s) Educated Patient   Methods Explanation   Comprehension Verbalized understanding             PT Long Term Goals - 12/06/16 0934      PT LONG TERM GOAL #1   Title Pt will be independent with HEP in order to improve strength and range of motion in order to decrease pain and improve function at home, work, and with leisure activities such as gardening.   Time 6   Status New     PT LONG TERM GOAL #2   Title Pt will increase LEFS by at least 9 points in order to demonstrate significant improvement in lower extremity function.     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 She tolerated quad strengthening program this date, though will require time and repetition to increase functional quad strength.    Clinical Presentation Stable   Clinical Decision Making Moderate   Rehab Potential Excellent   Clinical Impairments Affecting Rehab Potential Positive: motivation, age, partial replacement, active; Negative: history of L knee problems   PT Frequency 2x / week   PT Duration 6 weeks   PT Treatment/Interventions Aquatic Therapy;ADLs/Self Care Home Management;Cryotherapy;Electrical Stimulation;Iontophoresis 4mg /ml Dexamethasone;Moist Heat;Ultrasound;DME Instruction;Gait training;Stair training;Functional mobility training;Therapeutic activities;Therapeutic exercise;Balance training;Neuromuscular re-education;Patient/family education;Manual techniques;Scar mobilization   PT Next Visit Plan Manual techniques as appropriate, strengthening for knee, consider estim due to poor quad firing with quad set   PT Home Exercise Plan quad sets, standing knee rocking for quad contraction, standing marching, standing hip abduction with band, standing mini squats, standing heel raises, seated or supine knee flexion stretching per patient preference   Consulted  and Agree with Plan of Care Patient      Patient will benefit from skilled therapeutic intervention in order to improve the following deficits and impairments:  Abnormal gait, Decreased balance, Decreased strength, Decreased range of motion, Pain  Visit Diagnosis: Acute pain of right knee  Muscle weakness (generalized)  Stiffness of right knee, not elsewhere classified     Problem List Patient Active Problem List   Diagnosis Date Noted  . Asthma, exercise induced 09/20/2014  . Overweight 09/20/2014   Alva Garnet PT, DPT, CSCS    12/27/2016, 3:34 PM  [place] [facility] PHYSICAL AND SPORTS MEDICINE [address], [place], [address] Phone: [phone]   Fax:  [phone]  Name: Traci Drake MRN: [id number] Date  of Birth: 03/31/1956

## 2017-01-01 ENCOUNTER — Ambulatory Visit: Admitting: Physical Therapy

## 2017-01-01 DIAGNOSIS — M6281 Muscle weakness (generalized): Secondary | ICD-10-CM

## 2017-01-01 DIAGNOSIS — M25661 Stiffness of right knee, not elsewhere classified: Secondary | ICD-10-CM

## 2017-01-01 DIAGNOSIS — M25561 Pain in right knee: Secondary | ICD-10-CM

## 2017-01-01 NOTE — Therapy (Deleted)
[facility] PHYSICAL AND SPORTS MEDICINE [address] [place], [place], [address] Phone: [phone]04   Fax:  [phone]  Physical Therapy Treatment  Patient Details  Name: Traci Drake MRN: [id number] Date of Birth: 06-02-1956 Referring Provider: [name]  Encounter Date: 01/01/2017      PT End of Session - 01/01/17 0956    Visit Number 6   Number of Visits 13   Date for PT Re-Evaluation 01/17/17   Authorization Type no g codes   PT Start Time 0910   PT Stop Time 0949   PT Time Calculation (min) 39 min   Activity Tolerance Patient tolerated treatment well   Behavior During Therapy [facility] for tasks assessed/performed      Past Medical History:  Diagnosis Date  . Decreased libido without sexual dysfunction   . Menopause     Past Surgical History:  Procedure Laterality Date  . APPENDECTOMY  1989  . BREAST BIOPSY  2003    There were no vitals filed for this visit.      Subjective Assessment - 01/01/17 0907    Subjective Patient reports she continues to have pain at night that keeps her up at night and is still not sleeping well. She wasn't up for going to the gym    Pertinent History Pt [facility] [name]. No post-op complications. She started PT POD#1. Staples removed yesterday at her 2 week follow-up appointment by a resident. Pt had HH PT for the last 2 weeks (3d/wk). Per pt they worked on stretching, walking, and strengthening exercises. Last range of motion measure was 94 degrees flexion, unable to recall extension but was told that it was "good". Next follow-up appointment is 01/02/17 with MD. Pt reports she had increased pain/stiffness last night and increased stiffness this morning. Denies chills, fever, night sweats, nausea, and vomiting. Swelling has been gradually decreasing in RLE.   Limitations Walking   How long can you walk comfortably? 45 minutes   Diagnostic tests Serial radiographs, no problems with components as reported by patient   Patient Stated Goals Decrease pain and improve function. Get back to working in the yard   Currently in Pain? Yes   Pain Onset 1 to 4 weeks ago      NuStep level 1 x 5 minutes for warm up (unbilled)   TRX squats x 8 for 2 sets   Standing hip abduction on MATRIX x 40# x 8 for 3 sets   Leg Press 65# x 8, 55# x 12 for speed for 3 sets   Step Ups x 10 for 3 sets to 2 risers with 2 finger to 2 HHA for increased force reliance from UE   Soft tissue mobilization provided, less tenderness noted in VMO just noted to have increased tenderness in adductor compartment.   Grade I-II joint mobilizations.                            PT Education - 01/01/17 0956    Education provided Yes   Education Details Her loss of muscle density and quality is likely playing a large role in her ongoing symptoms.    Person(s) Educated Patient   Methods Explanation   Comprehension Verbalized understanding             PT Long Term Goals - 12/06/16 0934      PT LONG  TERM GOAL #1   Title Pt will be independent with HEP in order to improve strength and range of motion in order to decrease pain and improve function at home, work, and with leisure activities such as gardening.   Time 6   Status New     PT LONG TERM GOAL #2   Title Pt will increase LEFS by at least 9 points in order to demonstrate significant improvement in lower extremity function.     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 She is tolerating all progressive loading well, reporting she feels better after each session.    Clinical Presentation Stable   Clinical Decision Making Moderate   Rehab Potential Excellent   Clinical Impairments Affecting Rehab Potential Positive: motivation, age, partial replacement, active; Negative: history of L knee problems   PT Frequency 2x / week   PT Duration 6 weeks   PT Treatment/Interventions Aquatic Therapy;ADLs/Self Care Home Management;Cryotherapy;Electrical Stimulation;Iontophoresis 4mg /ml Dexamethasone;Moist Heat;Ultrasound;DME Instruction;Gait training;Stair training;Functional mobility training;Therapeutic activities;Therapeutic exercise;Balance training;Neuromuscular re-education;Patient/family education;Manual techniques;Scar mobilization   PT Next Visit Plan Manual techniques as appropriate, strengthening for knee, consider estim due to poor quad firing with quad set   PT Home Exercise Plan quad sets, standing knee rocking for quad contraction, standing marching, standing hip abduction with band, standing mini squats, standing heel raises, seated or supine knee flexion stretching per patient preference   Consulted and Agree with Plan of Care Patient      Patient will benefit from skilled therapeutic intervention in order to improve the following deficits and impairments:  Abnormal gait, Decreased balance, Decreased strength, Decreased range of motion, Pain  Visit Diagnosis: Acute pain of right knee  Muscle weakness (generalized)  Stiffness of right  knee, not elsewhere classified     Problem List Patient Active Problem List   Diagnosis Date Noted  . Asthma, exercise induced 09/20/2014  . Overweight 09/20/2014   Alva Garnet PT, DPT, CSCS    01/01/2017, 9:58 AM  [place] [facility] PHYSICAL AND SPORTS MEDICINE [address] [place], [address] Phone: [phone]   Fax:  [phone]  Name: Traci Drake MRN: [id number] Date of Birth: 27-Apr-1956

## 2017-01-01 NOTE — Patient Instructions (Addendum)
NuStep level 1 x 5 minutes for warm up (unbilled)   TRX squats x 8 for 2 sets   Standing hip abduction on MATRIX x 40# x 8 for 3 sets   Leg Press 65# x 8, 55# x 12 for speed for 3 sets   Step Ups x 10 for 3 sets to

## 2017-01-01 NOTE — Therapy (Signed)
[name]ICAL [facility]TS MEDICINE [address] [place], [place], [address] Phone: [phone]   Fax:  [phone]  Physical Therapy Treatment  Patient Details  Name: Traci KernCatherine H Drake MRN: [id number] Date of Birth: 09/26/1955 Referring Provider: [name]  Encounter Date: 01/01/2017      PT End of Session - 01/01/17 0956    Visit Number 6   Number of Visits 13   Date for PT Re-Evaluation 01/17/17   Authorization Type no g codes   PT Start Time 0910   PT Stop Time 0949   PT Time Calculation (min) 39 min   Activity Tolerance Patient tolerated treatment well   Behavior During Therapy [facility] for tasks assessed/performed      Past Medical History:  Diagnosis Date  . Decreased libido without sexual dysfunction   . Menopause     Past Surgical History:  Procedure Laterality Date  . APPENDECTOMY  1989  . BREAST BIOPSY  2003    There were no vitals filed for this visit.      Subjective Assessment - 01/01/17 0907    Subjective Patient reports she continues to have pain at night that keeps her up at night and is still not sleeping well. She wasn't up for going to the gym due to knee pain/sorenss.    Pertinent History Pt [name] [name]. No post-op complications. She started PT POD#1. Staples removed yesterday at her 2 week follow-up appointment by a resident. Pt had HH PT for the last 2 weeks (3d/wk). Per pt they worked on stretching, walking, and strengthening exercises. Last range of motion measure was 94 degrees flexion, unable to recall extension but was told that it was "good". Next follow-up appointment is 01/02/17 with MD. Pt reports she had increased pain/stiffness last night and increased stiffness this morning. Denies chills, fever, night sweats, nausea, and vomiting. Swelling has been gradually decreasing in RLE.   Limitations Walking   How long can you walk  comfortably? 45 minutes   Diagnostic tests Serial radiographs, no problems with components as reported by patient   Patient Stated Goals Decrease pain and improve function. Get back to working in the yard   Currently in Pain? Yes  Does not rate but reports achiness/soreness especially in lateral compartment of the knee joint.    Pain Onset 1 to 4 weeks ago      NuStep level 1 x 5 minutes for warm up (unbilled)   TRX squats x 8 for 2 sets (reports stiffness/soreness but tolerable)   Standing hip abduction on MATRIX x 40# x 8 for 3 sets   Leg Press 65# x 8, 55# x 12 for speed for 3 sets   Step Ups x 10 for 3 sets to 2 blocks with minimal to no HHA and good quad contraction noted   Performed STM primarily in VMO and into adductor compartmenrt of RLE with multiple areas of tenderness reported, though VMO area was noted to be much less sore with less palpable taut bands than in previous sessions.                            PT Education - 01/01/17 0956    Education provided Yes   Education Details Her loss of muscle density and quality is likely playing a large role in her ongoing symptoms.    Person(s) Educated Patient  Methods Explanation   Comprehension Verbalized understanding             PT Long Term Goals - 12/06/16 0934      PT LONG TERM GOAL #1   Title Pt will be independent with HEP in order to improve strength and range of motion in order to decrease pain and improve function at home, work, and with leisure activities such as gardening.   Time 6   Status New     PT LONG TERM GOAL #2   Title Pt will increase LEFS by at least 9 points in order to demonstrate significant improvement in lower extremity function.     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 She is tolerating all progressive loading well, reporting she feels better after each session.    Clinical Presentation Stable   Clinical Decision Making Moderate   Rehab Potential Excellent   Clinical Impairments Affecting Rehab Potential Positive: motivation, age, partial replacement, active; Negative: history of L knee problems   PT Frequency 2x / week   PT Duration 6 weeks   PT Treatment/Interventions Aquatic Therapy;ADLs/Self Care Home Management;Cryotherapy;Electrical Stimulation;Iontophoresis 4mg /ml Dexamethasone;Moist Heat;Ultrasound;DME Instruction;Gait training;Stair training;Functional mobility training;Therapeutic activities;Therapeutic exercise;Balance training;Neuromuscular re-education;Patient/family education;Manual techniques;Scar mobilization   PT Next Visit Plan Manual techniques as appropriate, strengthening for knee, consider estim due to poor quad firing with quad set   PT Home Exercise Plan quad sets, standing knee rocking for quad contraction, standing marching, standing hip abduction with band, standing mini squats, standing heel raises, seated or supine knee flexion stretching per patient preference   Consulted and Agree with Plan of Care Patient      Patient will benefit from skilled therapeutic intervention in order to improve the following  deficits and impairments:  Abnormal gait, Decreased balance, Decreased strength, Decreased range of motion, Pain  Visit Diagnosis: Acute pain of right knee  Muscle weakness (generalized)  Stiffness of right knee, not elsewhere classified     Problem List Patient Active Problem List   Diagnosis Date Noted  . Asthma, exercise induced 09/20/2014  . Overweight 09/20/2014   Alva Garnet PT, DPT, CSCS    01/01/2017, 12:47 PM  [place] [facility] PHYSICAL AND SPORTS MEDICINE [address], [place], [address] Phone: [phone]   Fax:  [phone]  Name: ANOLA MCGOUGH MRN: [id number] Date of Birth: January 28, 1956

## 2017-01-03 ENCOUNTER — Ambulatory Visit: Admitting: Physical Therapy

## 2017-01-03 DIAGNOSIS — M25661 Stiffness of right knee, not elsewhere classified: Secondary | ICD-10-CM

## 2017-01-03 DIAGNOSIS — M6281 Muscle weakness (generalized): Secondary | ICD-10-CM

## 2017-01-03 DIAGNOSIS — M25561 Pain in right knee: Secondary | ICD-10-CM | POA: Diagnosis not present

## 2017-01-03 NOTE — Therapy (Signed)
[place] [facility] PHYSICAL [facility] SPORTS MEDICINE [address] [place], [place], [address] Phone: [phone]   Fax:  [phone]  Physical Therapy Treatment  Patient Details  Name: Traci KernCatherine H Drake MRN: [id number] Date of Birth: 1955/11/29 Referring Provider: [name]  Encounter Date: 01/03/2017      PT End of Session - 01/03/17 1240    Visit Number 7   Number of Visits 13   Date for PT Re-Evaluation 01/17/17   Authorization Type no g codes   PT Start Time 0945   PT Stop Time 1030   PT Time Calculation (min) 45 min   Activity Tolerance Patient tolerated treatment well   Behavior During Therapy [facility] for tasks assessed/performed      Past Medical History:  Diagnosis Date  . Decreased libido without sexual dysfunction   . Menopause     Past Surgical History:  Procedure Laterality Date  . APPENDECTOMY  1989  . BREAST BIOPSY  2003    There were no vitals filed for this visit.      Subjective Assessment - 01/03/17 1234    Subjective Patient reports her surgical pain has largely subsided, she now has severe pain reactions to light brushing of her medial and lateral joint line compartments which keeps her up at night.    Pertinent History Pt [facility] medial R [facility] [name]. No post-op complications. She started PT POD#1. Staples removed yesterday at her 2 week follow-up appointment by a resident. Pt had HH PT for the last 2 weeks (3d/wk). Per pt they worked on stretching, walking, and strengthening exercises. Last range of motion measure was 94 degrees flexion, unable to recall extension but was told that it was "good". Next follow-up appointment is 01/02/17 with MD. Pt reports she had increased pain/stiffness last night and increased stiffness this morning. Denies chills, fever, night sweats, nausea, and vomiting. Swelling has been gradually decreasing in RLE.   Limitations Walking   How long can  you walk comfortably? 45 minutes   Diagnostic tests Serial radiographs, no problems with components as reported by patient   Patient Stated Goals Decrease pain and improve function. Get back to working in the yard   Currently in Pain? Other (Comment)  Minimal to no pain at rest, but sharp pain with light touch around medial/lateral portion of the knee joint on R       NuStep for active knee flexion/extension warm up and discussion of current symptoms x 5 minutes   Leg Press -- attempted at 75# x 5 reps and patient began grimacing secondary to R knee pain   Soft tissue mob -- performed on bilateral portions of the incision site, notable for several portions of the knee joint having allodynia/hyperalgesia which was mildly reduced with repeated sensory input from STM  High volt stim x 120V on both lateral joint line and VMO areas x 10 minutes with mild reduction in allodynia with palpation noted  Palpation throughout medial compartment musculature with significant reduction in pain noted by patient.   Discussed use of warm bath and movement, mirror therapy, and TENS to help reduce allodynia. Provided Explain Pain by Frederick PeersLorimer Moseley and Merril Abbeavid Butler to provide more insight into nervous system changes associated with allodynia.                            PT Education - 01/03/17 1239  Education provided Yes   Education Details TENS unit, patient borrowed a copy of Explain pain, role of nervous system in the allodynia she is experiencing.    Person(s) Educated Patient   Methods Explanation;Demonstration;Handout   Comprehension Verbalized understanding             PT Long Term Goals - 12/06/16 0934      PT LONG TERM GOAL #1   Title Pt will be independent with HEP in order to improve strength and range of motion in order to decrease pain and improve function at home, work, and with leisure activities such as gardening.   Time 6   Status New     PT LONG TERM  GOAL #2   Title Pt will increase LEFS by at least 9 points in order to demonstrate significant improvement in lower extremity function.     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 Her primary deficits include quadricep atrophy and weakness, as well as allodynia over medial and lateral portions of her incision. Educated patient on role of TENS, hydro therapy, and transitioning to gym based program which she agreed with.    Clinical Presentation Stable   Clinical Decision Making Moderate   Rehab Potential Excellent   Clinical Impairments Affecting Rehab Potential Positive: motivation, age, partial replacement, active; Negative: history of L knee problems   PT Frequency 2x / week   PT Duration 6 weeks   PT Treatment/Interventions Aquatic Therapy;ADLs/Self Care Home Management;Cryotherapy;Electrical Stimulation;Iontophoresis 4mg /ml Dexamethasone;Moist Heat;Ultrasound;DME Instruction;Gait training;Stair training;Functional mobility training;Therapeutic activities;Therapeutic exercise;Balance training;Neuromuscular re-education;Patient/family education;Manual techniques;Scar mobilization   PT Next Visit Plan Manual techniques as appropriate, strengthening for knee, consider  estim due to poor quad firing with quad set   PT Home Exercise Plan quad sets, standing knee rocking for quad contraction, standing marching, standing hip abduction with band, standing mini squats, standing heel raises, seated or supine knee flexion stretching per patient preference   Consulted and Agree with Plan of Care Patient      Patient will benefit from skilled therapeutic intervention in order to improve the following deficits and impairments:  Abnormal gait, Decreased balance, Decreased strength, Decreased range of motion, Pain  Visit Diagnosis: Acute pain of right knee  Muscle weakness (generalized)  Stiffness of right knee, not elsewhere classified     Problem List Patient Active Problem List   Diagnosis Date Noted  . Asthma, exercise induced 09/20/2014  . Overweight 09/20/2014   Alva Garnet PT, DPT, CSCS    01/03/2017, 12:44 PM  [place] [facility] PH[facility]ICAL [facility]ORTS MEDICINE [address] [place], [address] Phone: [phone]   Fax:  [phone]  Name: Traci Drake MRN: [id number] Date of Birth: [birth year]

## 2017-01-07 ENCOUNTER — Ambulatory Visit: Admitting: Physical Therapy

## 2017-01-08 ENCOUNTER — Ambulatory Visit: Admitting: Physical Therapy

## 2017-01-08 DIAGNOSIS — M25561 Pain in right knee: Secondary | ICD-10-CM | POA: Diagnosis not present

## 2017-01-08 DIAGNOSIS — M6281 Muscle weakness (generalized): Secondary | ICD-10-CM

## 2017-01-08 DIAGNOSIS — M25661 Stiffness of right knee, not elsewhere classified: Secondary | ICD-10-CM

## 2017-01-08 NOTE — Therapy (Signed)
[facility] PH[facility] MEDICINE [address] [place], [place], [address] Phone: [phone]   Fax:  [phone]  Physical Therapy Treatment  Patient Details  Name: Traci Drake MRN: [id number] Date of Birth: [birth year] Referring Provider: [name]  Encounter Date: 01/08/2017      PT End of Session - 01/08/17 1059    Visit Number 8   Number of Visits 13   Date for PT Re-Evaluation 01/17/17   Authorization Type no g codes   PT Start Time 1034   PT Stop Time 1118   PT Time Calculation (min) 44 min   Activity Tolerance Patient tolerated treatment well   Behavior During Therapy [facility] for tasks assessed/performed      Past Medical History:  Diagnosis Date  . Decreased libido without sexual dysfunction   . Menopause     Past Surgical History:  Procedure Laterality Date  . APPENDECTOMY  1989  . BREAST BIOPSY  2003    There were no vitals filed for this visit.      Subjective Assessment - 01/08/17 1052    Subjective Patient reports she was able to go to the gym yesterday, reports soreness in the knee joint and feels continued tightness in the joint. She read the Explain Pain book, which helped her to understand some of the complexities of pain.    Pertinent History Pt underwent medial R partial [facility] Dr. [name]. No post-op complications. She started PT POD#1. Staples removed yesterday at her 2 week follow-up appointment by a resident. Pt had HH PT for the last 2 weeks (3d/wk). Per pt they worked on stretching, walking, and strengthening exercises. Last range of motion measure was 94 degrees flexion, unable to recall extension but was told that it was "good". Next follow-up appointment is 01/02/17 with MD. Pt reports she had increased pain/stiffness last night and increased stiffness this morning. Denies chills, fever, night sweats, nausea, and vomiting. Swelling has been gradually decreasing in  RLE.   Limitations Walking   How long can you walk comfortably? 45 minutes   Diagnostic tests Serial radiographs, no problems with components as reported by patient   Patient Stated Goals Decrease pain and improve function. Get back to working in the yard   Currently in Pain? Yes   Pain Score --  Reports tightness and feeling of swelling in the knee joint   Pain Location Knee   Pain Orientation Right   Pain Descriptors / Indicators Aching;Tightness   Pain Type Surgical pain   Pain Onset More than a month ago   Pain Frequency Constant      High volt stim at 100V to distal quadriceps and proximal tibia (2 different sets) with moist hot pack applied to quadriceps.  After hot pack and high volt stim applied, educated patient to complete medial and lateral glides of the patella. Performed patellar glides in both directions which she reported were tight and mildly reproduced her symptoms. Palpation in the medial quadriceps did not elicit pain response that was noted several weeks ago. Discussed with and educated patient that she would benefit from hot pack and stim unit around the retinaculum, followed by medial and lateral patellar glides to help reduce her sensation of tightness. Noted to still have some edema around knee joint.  PT Education - 01/08/17 1058    Education provided Yes   Education Details Heat and E-stim multiple times per day before performing medial/lateral patellar glides.    Person(s) Educated Patient   Methods Explanation;Demonstration;Handout   Comprehension Verbalized understanding;Returned demonstration             PT Long Term Goals - 12/06/16 0934      PT LONG TERM GOAL #1   Title Pt will be independent with HEP in order to improve strength and range of motion in order to decrease pain and improve function at home, work, and with leisure activities such as gardening.   Time 6   Status New     PT LONG TERM  GOAL #2   Title Pt will increase LEFS by at least 9 points in order to demonstrate significant improvement in lower extremity function.     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 She was able to go to the gym yesterday with soreness but not overt pain this date. Her ROM is quite good at this point (flexion to 130 degrees). Educated patient on heating followed by soft tissue mobilization/patellar mobilizations to reduce soft tissue tightness she is reporting.    Clinical Presentation Stable   Clinical Decision Making Moderate   Rehab Potential Excellent   Clinical Impairments Affecting Rehab Potential Positive: motivation, age, partial replacement, active; Negative: history of L knee problems   PT Frequency 2x / week   PT Duration 6 weeks   PT Treatment/Interventions Aquatic Therapy;ADLs/Self Care Home Management;Cryotherapy;Electrical Stimulation;Iontophoresis 4mg /ml Dexamethasone;Moist Heat;Ultrasound;DME Instruction;Gait training;Stair training;Functional mobility training;Therapeutic activities;Therapeutic exercise;Balance training;Neuromuscular re-education;Patient/family education;Manual techniques;Scar mobilization   PT Next  Visit Plan Manual techniques as appropriate, strengthening for knee, consider estim due to poor quad firing with quad set   PT Home Exercise Plan quad sets, standing knee rocking for quad contraction, standing marching, standing hip abduction with band, standing mini squats, standing heel raises, seated or supine knee flexion stretching per patient preference   Consulted and Agree with Plan of Care Patient      Patient will benefit from skilled therapeutic intervention in order to improve the following deficits and impairments:  Abnormal gait, Decreased balance, Decreased strength, Decreased range of motion, Pain  Visit Diagnosis: Acute pain of right knee  Muscle weakness (generalized)  Stiffness of right knee, not elsewhere classified     Problem List Patient Active Problem List   Diagnosis Date Noted  . Asthma, exercise induced 09/20/2014  .  Overweight 09/20/2014  01/08/2017, 1:18 PM  Traci Drake PT, DPT, CSCS    [facility] PHYSICAL AND SPORTS MEDICINE [address], [place], [address] Phone: [phone]   Fax:  [phone]  Name: Traci Drake MRN: [id number] Date of Birth: [birth year]

## 2017-01-08 NOTE — Patient Instructions (Signed)
High volt stim at 100V to distal quadriceps and proximal tibia (2 different sets) with moist hot pack applied to quadriceps.

## 2017-01-10 ENCOUNTER — Ambulatory Visit: Admitting: Physical Therapy

## 2017-01-14 ENCOUNTER — Encounter: Admitting: Physical Therapy

## 2017-01-17 ENCOUNTER — Encounter: Admitting: Physical Therapy

## 2017-01-21 ENCOUNTER — Encounter: Admitting: Physical Therapy

## 2017-01-24 ENCOUNTER — Encounter: Admitting: Physical Therapy

## 2018-01-23 ENCOUNTER — Ambulatory Visit: Attending: Orthopedic Surgery | Admitting: Physical Therapy

## 2018-01-23 ENCOUNTER — Encounter: Payer: Self-pay | Admitting: Physical Therapy

## 2018-01-23 DIAGNOSIS — G8929 Other chronic pain: Secondary | ICD-10-CM | POA: Insufficient documentation

## 2018-01-23 DIAGNOSIS — M25562 Pain in left knee: Secondary | ICD-10-CM | POA: Insufficient documentation

## 2018-01-23 DIAGNOSIS — M25561 Pain in right knee: Secondary | ICD-10-CM | POA: Insufficient documentation

## 2018-01-23 NOTE — Therapy (Signed)
[place] [facility] [address] [place], [place], [address] Phone: [phone]   Fax:  [phone]  Physical Therapy Treatment  Patient Details  Name: Traci Drake MRN: [id number] Date of Birth: [birth year] Referring Provider: [name]   Encounter Date: 01/23/2018  PT End of Session - 01/23/18 0947    Visit Number  1    Number of Visits  17    Date for PT Re-Evaluation  03/20/18    PT Start Time  0930    PT Stop Time  1030    PT Time Calculation (min)  60 min    Activity Tolerance  Patient tolerated treatment well    Behavior During Therapy  [facility] for tasks assessed/performed       Past Medical History:  Diagnosis Date  . Decreased libido without sexual dysfunction   . Menopause     Past Surgical History:  Procedure Laterality Date  . APPENDECTOMY  1989  . BREAST BIOPSY  2003    There were no vitals filed for this visit.  Subjective Assessment - 01/23/18 0937    Subjective  Bilat knee OA    Pertinent History  Patient is a 62 year old female s/p R partial knee replacement 11/20/17, presenting with bilat knee OA. Patient is planning to have a L TKA, but would like to conservatively manage L if able. 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 Patient reports more pain in the am when she "gets started moving". Patient reports worst pain 6/10 in bilat knees and best 0/10. Pt denies N/V, unexplained weight fluctuation, saddle paresthesia, fever, B&B changes, night sweats, or unrelenting night pain at this time.    How long can you sit comfortably?  30min    How long can you stand comfortably?  unlimited with pain after    How long can you walk comfortably?  unlimited with pain after    Patient Stated Goals  Decrease pain to garden and complete job duties without pain    Currently in Pain?  Yes    Pain Score  0-No pain    Pain Location  Knee    Pain Orientation   Right;Left    Pain Descriptors / Indicators  Aching;Sharp;Dull    Pain Type  Chronic pain    Pain Onset  More than a month ago    Pain Frequency  Intermittent    Aggravating Factors   Squatting, prolonged standing/walking, waking up in the am    Pain Relieving Factors  TENS, heat          ROM All lumbar motions wnl w/ pain w/ L lateral bending on L LB  Hip motions restricted   Strength Hip flex: 4/5 bilat Hip abd 4-/5 bilat Hip add: 4-/5 bilat Hip Ext in prone with bent knee: 3+/5 Hip IR: 4/5 bilat Hip ER: 3+/5 bilat Knee flex 4+/5 bilat Knee ext 4+/5 bilat Ankle DF 5/5 bilat Ankle PF    Special Tests/ Other (+) Elys on L for hip flexor tightness (-) 90/90 bilat (-) FABER  (-) FADIR (+) Obers bilat (-) Scour bilat 5xSTS 22sec with minimal use of hands 6MWT 126575ft with lack of full hip and knee ext bilat with 6/10 pain in bilat knees following   Ther-Ex -Bridge exercise x10 with cuing initially for proper form with glute contraction (HEP) - Sidelying clamshell with red tband with min cuing for eccentric control  x 10 (HEP) - Standing abd red tband with min cuing for eccentric control x 10 (HEP) - Mini squat x10 with min cuing for proper form and posture with full hip ext -Education on PT role and there-ex role on ain andp strengthening muscles surrounding the knees to increase stability and decrease pain  [facility] PT Assessment - 01/23/18 0001      Assessment   Medical Diagnosis  Bilat knee OA    Referring Provider  Dr. Melina Fiddler    Onset Date/Surgical Date  11/20/17    Hand Dominance  Right    Next MD Visit  Not scheduled    Prior Therapy  Yes      Balance Screen   Has the patient fallen in the past 6 months  No    Has the patient had a decrease in activity level because of a fear of falling?   No    Is the patient reluctant to leave their home because of a fear of falling?   No      Home Environment   Living Environment  Private residence    Living  Arrangements  Spouse/significant other    Available Help at Discharge  Family    Type of Home  House    Home Access  Stairs to enter    Entrance Stairs-Number of Steps  2    Entrance Stairs-Rails  Left    Home Layout  Multi-level    Alternate Level Stairs-Number of Steps  15    Alternate Level Stairs-Rails  Right      Prior Function   Level of Independence  Independent    Vocation  Part time employment    Development worker, community labor and delivery    Leisure  gardening;       Cognition   Overall Cognitive Status  Within Functional Limits for tasks assessed      Sensation   Light Touch  Appears Intact                 .                  PT Education - 01/23/18 0946    Education provided  Yes    Education Details  Patient was educated on diagnosis, anatomy and pathology involved, prognosis, role of PT, and was given an HEP, demonstrating exercise with proper form following verbal and tactile cues, and was given a paper hand out to continue exercise at home. Pt was educated on and agreed to plan of care.    Person(s) Educated  Patient    Methods  Explanation;Demonstration;Tactile cues;Verbal cues;Handout    Comprehension  Tactile cues required;Verbal cues required;Returned demonstration;Verbalized understanding       PT Short Term Goals - 01/23/18 1051      PT SHORT TERM GOAL #1   Title  Pt will be independent with HEP in order to improve strength and balance in order to increase strength and improve function at home and work    Time  4    Period  Weeks    Status  New        PT Long Term Goals - 01/23/18 1054      PT LONG TERM GOAL #1   Title  Pt will increase by at least 82m (128ft) in order to demonstrate clinically significant improvement in muscular endurance and community ambulation    Baseline  01/23/18 1226ft with gait deviation    Time  8    Status  New      PT LONG TERM GOAL #2   Title  Pt will decrease 5TSTS by at least 3  seconds in order to demonstrate clinically significant improvement in LE strength    Baseline  01/23/18 22sec with min use of hands    Time  8    Period  Weeks    Status  New      PT LONG TERM GOAL #3   Title  Pt will decrease worst pain as reported on NPRS by at least 3 points in order to demonstrate clinically significant reduction in pain to complete job duties    Baseline  01/23/18 6/10 pain with ambulation and with squatting    Time  8    Period  Weeks    Status  New      PT LONG TERM GOAL #4   Title  Patient will increase FOTO score to 62 to demonstrate predicted increase in functional mobility to complete ADLs    Baseline  01/23/18 49    Time  8    Status  New      PT LONG TERM GOAL #5   Title  Pt will increase LEFS by at least 9 points in order to demonstrate significant improvement in lower extremity function.    Baseline  01/23/18 53%    Time  8    Period  Weeks    Status  New            Plan - 01/23/18 1220    Clinical Impression Statement  Patient is a 62 year old female presenting with bilat knee OA with pain. Patient underwent R unilateral knee replacement, and is planning TKA of L knee, but would like to hold off on R knee sx as long as possible. Patient demonstrating impairments in pain, LE strength and endurance, and bilat LE pain. Patient is unable to squat/stoop and walk/stand for prolonged time; inhibiting her from being able to fully participate in her role as a labor and delivery RN. Pt will benefit from skilled PT to address these impairments to return to PLOF.     Clinical Presentation  Evolving    Clinical Presentation due to:  2 personal factors/comorbidities, 3 body systems/activity limitations/participation restrictions     Clinical Decision Making  Moderate    Rehab Potential  Good    Clinical Impairments Affecting Rehab Potential  Positive: motivation, age, partial replacement, active; Negative: history of L knee problems    PT Frequency  2x / week    PT  Duration  8 weeks    PT Treatment/Interventions  Aquatic Therapy;ADLs/Self Care Home Management;Cryotherapy;Electrical Stimulation;Iontophoresis 4mg /ml Dexamethasone;Moist Heat;Ultrasound;DME Instruction;Gait training;Stair training;Functional mobility training;Therapeutic activities;Therapeutic exercise;Balance training;Neuromuscular re-education;Patient/family education;Manual techniques;Scar mobilization;Dry needling    PT Next Visit Plan  HEP and goal review    PT Home Exercise Plan  mini squat, bridge, standing abd, clamshell    Consulted and Agree with Plan of Care  Patient       Patient will benefit from skilled therapeutic intervention in order to improve the following deficits and impairments:  Abnormal gait, Decreased balance, Decreased strength, Decreased range of motion, Pain, Improper body mechanics, Postural dysfunction, Decreased coordination, Decreased endurance, Decreased activity tolerance, Difficulty walking  Visit Diagnosis: Chronic pain of left knee  Chronic pain of right knee     Problem List Patient Active Problem List   Diagnosis Date Noted  . Asthma, exercise induced 09/20/2014  . Overweight 09/20/2014  Staci Acosta PT, DPT Staci Acosta 01/23/2018, 12:51 PM  [facility] PHYSICAL AND SPORTS MEDICINE [address] [place], [address] Phone: [phone]   Fax:  [phone]  Name: Traci Drake MRN: [id number] Date of Birth: 1956-01-10

## 2018-01-27 ENCOUNTER — Encounter: Payer: Self-pay | Admitting: Physical Therapy

## 2018-01-27 ENCOUNTER — Ambulatory Visit: Admitting: Physical Therapy

## 2018-01-27 DIAGNOSIS — M25562 Pain in left knee: Principal | ICD-10-CM

## 2018-01-27 DIAGNOSIS — G8929 Other chronic pain: Secondary | ICD-10-CM

## 2018-01-27 DIAGNOSIS — M25561 Pain in right knee: Secondary | ICD-10-CM

## 2018-01-27 NOTE — Therapy (Signed)
[place] [facility] [address] [place], [place], [address] Phone: [phone]   Fax:  [phone]  Physical Therapy Treatment  Patient Details  Name: Traci KernCatherine H Badley MRN: [id number] Date of Birth: Sep 09, 1955 Referring Provider: [name]   Encounter Date: 01/27/2018  PT End of Session - 01/27/18 0908    Visit Number  2    Number of Visits  17    Date for PT Re-Evaluation  03/20/18    PT Start Time  0900    PT Stop Time  0400    PT Time Calculation (min)  1140 min    Activity Tolerance  Patient tolerated treatment well    Behavior During Therapy  [facility] for tasks assessed/performed       Past Medical History:  Diagnosis Date  . Decreased libido without sexual dysfunction   . Menopause     Past Surgical History:  Procedure Laterality Date  . APPENDECTOMY  1989  . BREAST BIOPSY  2003    There were no vitals filed for this visit.  Subjective Assessment - 01/27/18 0905    Subjective  Patient reports she worked this weekend which increased h question about form on her pain to 6-7/10 but that since her pain has decreased to 2-3/10. Patient reports pain is the same in both knees. Patient reports compliance with her HEP with a question about form with her clamshell exercise.     Pertinent History  Patient is a 62 year old female s/p R partial knee replacement 11/20/17, presenting with bilat knee OA. Patient is planning to have a L TKA, but would like to conservatively manage L if able. 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 Patient reports more pain in the am when she "gets started moving". Patient reports worst pain 6/10 in bilat knees and best 0/10. Pt denies N/V, unexplained weight fluctuation, saddle paresthesia, fever, B&B changes, night sweats, or unrelenting night pain at this time.    Limitations  Walking    How long can you sit comfortably?  30min    How long  can you stand comfortably?  unlimited with pain after    How long can you walk comfortably?  unlimited with pain after    Diagnostic tests  Serial radiographs, no problems with components as reported by patient    Patient Stated Goals  Decrease pain to garden and complete job duties without pain          Ther-Ex - 5 min nustep L2 increased to L3 - Clamshell x 10 each side red tband with min cuing to prevent  - Bridge x10; modified with red tband x 10 for better challenge - Mini squat 3x 10 with min cuing initially for full hip ext at stand phase with good carry over following -Standing hip abd red tband 3x 10 with min cuing for posture  -OMEGA knee ext 25# 3x 10 with min cuing for eccentric control - MATRIX hip ext 3x 10 55# with min cuing for posture                    PT Education - 01/27/18 0907    Education provided  Yes    Education Details  Exercise form    Person(s) Educated  Patient    Methods  Explanation;Demonstration;Verbal cues    Comprehension  Verbalized understanding;Returned demonstration;Verbal cues required       PT  Short Term Goals - 01/23/18 1051      PT SHORT TERM GOAL #1   Title  Pt will be independent with HEP in order to improve strength and balance in order to increase strength and improve function at home and work    Time  4    Period  Weeks    Status  New        PT Long Term Goals - 01/23/18 1054      PT LONG TERM GOAL #1   Title  Pt will increase by at least 20m (136ft) in order to demonstrate clinically significant improvement in muscular endurance and community ambulation    Baseline  01/23/18 1265ft with gait deviation    Time  8    Status  New      PT LONG TERM GOAL #2   Title  Pt will decrease 5TSTS by at least 3 seconds in order to demonstrate clinically significant improvement in LE strength    Baseline  01/23/18 22sec with min use of hands    Time  8    Period  Weeks    Status  New      PT LONG TERM GOAL #3    Title  Pt will decrease worst pain as reported on NPRS by at least 3 points in order to demonstrate clinically significant reduction in pain to complete job duties    Baseline  01/23/18 6/10 pain with ambulation and with squatting    Time  8    Period  Weeks    Status  New      PT LONG TERM GOAL #4   Title  Patient will increase FOTO score to 62 to demonstrate predicted increase in functional mobility to complete ADLs    Baseline  01/23/18 49    Time  8    Status  New      PT LONG TERM GOAL #5   Title  Pt will increase LEFS by at least 9 points in order to demonstrate significant improvement in lower extremity function.    Baseline  01/23/18 53%    Time  8    Period  Weeks    Status  New            Plan - 01/27/18 1029    Clinical Impression Statement  PT led patient through therex targetted at LE strengthening, particularly glute activation, which patient was able to complete with accuracy following PT cuing. PT updated patient's HEP, which patient demonstrated with accuracy and verbalized understanding of.     Rehab Potential  Good    Clinical Impairments Affecting Rehab Potential  Positive: motivation, age, partial replacement, active; Negative: history of L knee problems    PT Frequency  2x / week    PT Duration  8 weeks    PT Treatment/Interventions  Aquatic Therapy;ADLs/Self Care Home Management;Cryotherapy;Electrical Stimulation;Iontophoresis 4mg /ml Dexamethasone;Moist Heat;Ultrasound;DME Instruction;Gait training;Stair training;Functional mobility training;Therapeutic activities;Therapeutic exercise;Balance training;Neuromuscular re-education;Patient/family education;Manual techniques;Scar mobilization;Dry needling    PT Next Visit Plan  LE strengthening    PT Home Exercise Plan  mini squat, bridge with red tband, standing abd with red tband, clamshell with red tband    Consulted and Agree with Plan of Care  Patient       Patient will benefit from skilled therapeutic  intervention in order to improve the following deficits and impairments:  Abnormal gait, Decreased balance, Decreased strength, Decreased range of motion, Pain, Improper body mechanics, Postural dysfunction, Decreased coordination, Decreased endurance,  Decreased activity tolerance, Difficulty walking  Visit Diagnosis: Chronic pain of left knee  Chronic pain of right knee     Problem List Patient Active Problem List   Diagnosis Date Noted  . Asthma, exercise induced 09/20/2014  . Overweight 09/20/2014   Staci Acostahelsea Miller PT, DPT Staci Acostahelsea Miller 01/27/2018, 10:31 AM  [facility] [address] [place], [place], [address] Phone: [phone]   Fax:  [phone]  Name: Traci KernCatherine H Linck MRN: [id number] Date of Birth: [birth year]

## 2018-01-29 ENCOUNTER — Encounter: Payer: Self-pay | Admitting: Physical Therapy

## 2018-01-29 ENCOUNTER — Ambulatory Visit: Admitting: Physical Therapy

## 2018-01-29 DIAGNOSIS — G8929 Other chronic pain: Secondary | ICD-10-CM

## 2018-01-29 DIAGNOSIS — M25561 Pain in right knee: Secondary | ICD-10-CM

## 2018-01-29 DIAGNOSIS — M25562 Pain in left knee: Principal | ICD-10-CM

## 2018-01-29 NOTE — Therapy (Signed)
[facility]ICINE [address] [place], [address] Phone: [phone]   Fax:  [phone]  Physical Therapy Treatment  Patient Details  Name: Traci Drake MRN: [id number] Date of Birth: 1956-05-09 Referring Provider: Dr. Melina Fiddler   Encounter Date: 01/29/2018  PT End of Session - 01/29/18 1052    Visit Number  3    Number of Visits  17    Date for PT Re-Evaluation  03/20/18    PT Start Time  1045    PT Stop Time  1130    PT Time Calculation (min)  45 min    Activity Tolerance  Patient tolerated treatment well    Behavior During Therapy  [facility] for tasks assessed/performed       Past Medical History:  Diagnosis Date  . Decreased libido without sexual dysfunction   . Menopause     Past Surgical History:  Procedure Laterality Date  . APPENDECTOMY  1989  . BREAST BIOPSY  2003    There were no vitals filed for this visit.  Subjective Assessment - 01/29/18 1048    Subjective  Patient reports she went to the gym yesterday and was able to perform some of the exercises from last session and had some soreness but no increased pain. Patient reports 4/10 pain in bilat knees. Patient reports she was working on her "squat form" and thinks it is getting better as well.     Pertinent History  Patient is a 62 year old female s/p R partial knee replacement 11/20/17, presenting with bilat knee OA. Patient is planning to have a L TKA, but would like to conservatively manage L if able. 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 Patient reports more pain in the am when she "gets started moving". Patient reports worst pain 6/10 in bilat knees and best 0/10. Pt denies N/V, unexplained weight fluctuation, saddle paresthesia, fever, B&B changes, night sweats, or unrelenting night pain at this time.    Limitations  Walking    How long can you sit comfortably?     How long can  you stand comfortably?  unlimited with pain after    How long can you walk comfortably?  unlimited with pain after    Diagnostic tests  Serial radiographs, no problems with components as reported by patient    Patient Stated Goals  Decrease pain to garden and complete job duties without pain    Pain Onset  More than a month ago         Ther-Ex - 5 min nustep L3 - Mini squat 3x 10 with min cuing initially for full hip ext at stand phase with good carry over following - Walking lunge 5 lunges, difficult for patient to complete with proper form- modified to static lunge 2x 5e with demo and cuing initially for proper form with good carry over following - MATRIX hip abd 3x 10 40# with min cuing for posture - Bridge with bosu ball 3x 10 with min cuing for full hip ext/glute contraction                      PT Education - 01/29/18 1051    Education provided  Yes    Education Details  Exercise form    Person(s) Educated  Patient    Methods  Explanation;Demonstration;Verbal cues    Comprehension  Verbalized understanding;Verbal cues required;Returned demonstration  PT Short Term Goals - 01/23/18 1051      PT SHORT TERM GOAL #1   Title  Pt will be independent with HEP in order to improve strength and balance in order to increase strength and improve function at home and work    Time  4    Period  Weeks    Status  New        PT Long Term Goals - 01/23/18 1054      PT LONG TERM GOAL #1   Title  Pt will increase 6MWT by at least 3134m (16964ft) in order to demonstrate clinically significant improvement in muscular endurance and community ambulation    Baseline  01/23/18 121675ft with gait deviation    Time  8    Status  New      PT LONG TERM GOAL #2   Title  Pt will decrease 5TSTS by at least 3 seconds in order to demonstrate clinically significant improvement in LE strength    Baseline  01/23/18 22sec with min use of hands    Time  8    Period  Weeks    Status  New       PT LONG TERM GOAL #3   Title  Pt will decrease worst pain as reported on NPRS by at least 3 points in order to demonstrate clinically significant reduction in pain to complete job duties    Baseline  01/23/18 6/10 pain with ambulation and with squatting    Time  8    Period  Weeks    Status  New      PT LONG TERM GOAL #4   Title  Patient will increase FOTO score to 62 to demonstrate predicted increase in functional mobility to complete ADLs    Baseline  01/23/18 49    Time  8    Status  New      PT LONG TERM GOAL #5   Title  Pt will increase LEFS by at least 9 points in order to demonstrate significant improvement in lower extremity function.    Baseline  01/23/18 53%    Time  8    Period  Weeks    Status  New            Plan - 01/29/18 1116    Clinical Impression Statement  PT continued to led patient through therex progression, which she was able to complete with accuracy following PT cuing. Patient is increasingability to self-correct form with therex as well.     Rehab Potential  Good    Clinical Impairments Affecting Rehab Potential  Positive: motivation, age, partial replacement, active; Negative: history of L knee problems    PT Frequency  2x / week    PT Duration  8 weeks    PT Treatment/Interventions  Aquatic Therapy;ADLs/Self Care Home Management;Cryotherapy;Electrical Stimulation;Iontophoresis 4mg /ml Dexamethasone;Moist Heat;Ultrasound;DME Instruction;Gait training;Stair training;Functional mobility training;Therapeutic activities;Therapeutic exercise;Balance training;Neuromuscular re-education;Patient/family education;Manual techniques;Scar mobilization;Dry needling    PT Next Visit Plan  LE strengthening    PT Home Exercise Plan  mini squat, bridge with red tband, standing abd with red tband, clamshell with red tband    Consulted and Agree with Plan of Care  Patient       Patient will benefit from skilled therapeutic intervention in order to improve the following  deficits and impairments:  Abnormal gait, Decreased balance, Decreased strength, Decreased range of motion, Pain, Improper body mechanics, Postural dysfunction, Decreased coordination, Decreased endurance, Decreased activity tolerance, Difficulty walking  Visit Diagnosis: Chronic pain of left knee  Chronic pain of right knee     Problem List Patient Active Problem List   Diagnosis Date Noted  . Asthma, exercise induced 09/20/2014  . Overweight 09/20/2014   Staci Acostahelsea Miller PT, DPT Staci Acostahelsea Miller 01/29/2018, 12:00 PM  [facility] [address] [place], [place], [id number] Phone: [phone]   Fax:  [phone]  Name: Traci Drake MRN: [id number] Date of Birth: [birth year]

## 2018-02-12 ENCOUNTER — Encounter: Payer: Self-pay | Admitting: Physical Therapy

## 2018-02-19 ENCOUNTER — Encounter: Payer: Self-pay | Admitting: Physical Therapy

## 2018-02-20 ENCOUNTER — Ambulatory Visit: Attending: Orthopedic Surgery | Admitting: Physical Therapy

## 2018-02-20 ENCOUNTER — Encounter: Payer: Self-pay | Admitting: Physical Therapy

## 2018-02-20 DIAGNOSIS — G8929 Other chronic pain: Secondary | ICD-10-CM | POA: Insufficient documentation

## 2018-02-20 DIAGNOSIS — M25562 Pain in left knee: Secondary | ICD-10-CM | POA: Diagnosis present

## 2018-02-20 NOTE — Therapy (Signed)
[facility] PH[facility]TS [facility]ICINE [address], [place], [address] Phone: [phone]   Fax:  [phone]  Physical Therapy Treatment  Patient Details  Name: Traci Drake MRN: [id number] Date of Birth: 1955-11-16 Referring Provider: Dr. Melina Fiddler   Encounter Date: 02/20/2018  PT End of Session - 02/20/18 1711    Visit Number  4    Number of Visits  17    Date for PT Re-Evaluation  03/20/18    PT Start Time  0505    PT Stop Time  0545    PT Time Calculation (min)  40 min    Activity Tolerance  Patient tolerated treatment well    Behavior During Therapy  [facility] for tasks assessed/performed       Past Medical History:  Diagnosis Date  . Decreased libido without sexual dysfunction   . Menopause     Past Surgical History:  Procedure Laterality Date  . APPENDECTOMY  1989  . BREAST BIOPSY  2003    There were no vitals filed for this visit.  Subjective Assessment - 02/20/18 1704    Subjective  Patient reports her knees are "stiff" today after working on her feet all day. Patient reports 5/10 pain in bilat knees. Patient reports compliance with her HEP with no compliants or concerns.     Pertinent History  Patient is a 62 year old female s/p R partial knee replacement 11/20/17, presenting with bilat knee OA. Patient is planning to have a L TKA, but would like to conservatively manage L if able. 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 Patient reports more pain in the am when she "gets started moving". Patient reports worst pain 6/10 in bilat knees and best 0/10. Pt denies N/V, unexplained weight fluctuation, saddle paresthesia, fever, B&B changes, night sweats, or unrelenting night pain at this time.    Limitations  Walking    How long can you sit comfortably?     How long can you stand comfortably?  unlimited with pain after    How long can you walk comfortably?   unlimited with pain after    Diagnostic tests  Serial radiographs, no problems with components as reported by patient    Patient Stated Goals  Decrease pain to garden and complete job duties without pain    Pain Onset  More than a month ago       Ther-Ex - 5 min bike for warmup duing hx intake - Walking lunge 10 lunges (5 down/5 back) with good carry over from last session with proper form needing only min cuing to correct - Lateral step down from 3in step 3x 10 each side with demo  - MATRIX hip ext 3x 8 40# with min cuing for posture initially with good carry over following (initially attempted at 55# which patient was unable to complete with proper form - OMEGA knee ext 25# 3x 10 with min cuing for eccentric control                          PT Education - 02/20/18 1710    Education provided  Yes    Education Details  Exercise form    Person(s) Educated  Patient    Methods  Explanation;Demonstration;Verbal cues    Comprehension  Verbalized understanding;Returned demonstration;Verbal cues required       PT Short Term Goals - 01/23/18  1051      PT SHORT TERM GOAL #1   Title  Pt will be independent with HEP in order to improve strength and balance in order to increase strength and improve function at home and work    Time  4    Period  Weeks    Status  New        PT Long Term Goals - 01/23/18 1054      PT LONG TERM GOAL #1   Title  Pt will increase by at least 41m (164ft) in order to demonstrate clinically significant improvement in muscular endurance and community ambulation    Baseline  01/23/18 1269ft with gait deviation    Time  8    Status  New      PT LONG TERM GOAL #2   Title  Pt will decrease 5TSTS by at least 3 seconds in order to demonstrate clinically significant improvement in LE strength    Baseline  01/23/18 22sec with min use of hands    Time  8    Period  Weeks    Status  New      PT LONG TERM GOAL #3   Title  Pt will decrease  worst pain as reported on NPRS by at least 3 points in order to demonstrate clinically significant reduction in pain to complete job duties    Baseline  01/23/18 6/10 pain with ambulation and with squatting    Time  8    Period  Weeks    Status  New      PT LONG TERM GOAL #4   Title  Patient will increase FOTO score to 62 to demonstrate predicted increase in functional mobility to complete ADLs    Baseline  01/23/18 49    Time  8    Status  New      PT LONG TERM GOAL #5   Title  Pt will increase LEFS by at least 9 points in order to demonstrate significant improvement in lower extremity function.    Baseline  01/23/18 53%    Time  8    Period  Weeks    Status  New            Plan - 02/20/18 1741    Clinical Impression Statement  PT continued therex progression to provide muscular stability around bilat knee joints. Patient was able to complete all therex wit accuracy following PT cuing, with fatigue at the end of sets and no increased pain.     PT Frequency  2x / week    PT Duration  8 weeks    PT Treatment/Interventions  Aquatic Therapy;ADLs/Self Care Home Management;Cryotherapy;Electrical Stimulation;Iontophoresis 4mg /ml Dexamethasone;Moist Heat;Ultrasound;DME Instruction;Gait training;Stair training;Functional mobility training;Therapeutic activities;Therapeutic exercise;Balance training;Neuromuscular re-education;Patient/family education;Manual techniques;Scar mobilization;Dry needling    PT Next Visit Plan  LE strengthening    PT Home Exercise Plan  mini squat, bridge with red tband, standing abd with red tband, clamshell with red tband    Consulted and Agree with Plan of Care  Patient       Patient will benefit from skilled therapeutic intervention in order to improve the following deficits and impairments:     Visit Diagnosis: Chronic pain of left knee     Problem List Patient Active Problem List   Diagnosis Date Noted  . Asthma, exercise induced 09/20/2014  .  Overweight 09/20/2014   Staci Acosta PT, DPT Staci Acosta 02/20/2018, 5:45 PM  [facility] PHYSICAL  [facility] SPORTS MEDICINE [address], [place], [address] Phone: [phone]   Fax:  [phone]  Name: NEREYDA BOWLER MRN: [id number] Date of Birth: [birth year]

## 2018-02-24 ENCOUNTER — Encounter: Payer: Self-pay | Admitting: Physical Therapy

## 2018-02-24 ENCOUNTER — Ambulatory Visit: Admitting: Physical Therapy

## 2018-02-24 DIAGNOSIS — M25562 Pain in left knee: Secondary | ICD-10-CM | POA: Diagnosis not present

## 2018-02-24 DIAGNOSIS — G8929 Other chronic pain: Secondary | ICD-10-CM

## 2018-02-24 NOTE — Therapy (Signed)
[facility] PH[facility]TS MEDICINE [address], [place], [address] Phone: [phone]   Fax:  [phone]  Physical Therapy Treatment  Patient Details  Name: Traci Drake MRN: [id number] Date of Birth: [birth year] Referring Provider: Dr. Melina Fiddler   Encounter Date: 02/24/2018  PT End of Session - 02/24/18 1534    Visit Number  5    Number of Visits  17    Date for PT Re-Evaluation  03/20/18    PT Start Time  0315    PT Stop Time  0400    PT Time Calculation (min)  45 min    Activity Tolerance  Patient tolerated treatment well    Behavior During Therapy  [facility] for tasks assessed/performed       Past Medical History:  Diagnosis Date  . Decreased libido without sexual dysfunction   . Menopause     Past Surgical History:  Procedure Laterality Date  . APPENDECTOMY  1989  . BREAST BIOPSY  2003    There were no vitals filed for this visit.  Subjective Assessment - 02/24/18 1522    Subjective  Patient reports 5/10 knee pain today after being on her feet all morning for work. Patient reports she went to the mountains this weekend and noticed she is unable to walk as much with hiking as she used to. Patient reports compliance with HEP with no questions or concerns.    Pertinent History  Patient is a 62 year old female s/p R partial knee replacement 11/20/17, presenting with bilat knee OA. Patient is planning to have a L TKA, but would like to conservatively manage L if able. 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 Patient reports more pain in the am when she "gets started moving". Patient reports worst pain 6/10 in bilat knees and best 0/10. Pt denies N/V, unexplained weight fluctuation, saddle paresthesia, fever, B&B changes, night sweats, or unrelenting night pain at this time.    Limitations  Walking    How long can you sit comfortably?     How long can you stand  comfortably?  unlimited with pain after    How long can you walk comfortably?  unlimited with pain after    Diagnostic tests  Serial radiographs, no problems with components as reported by patient    Patient Stated Goals  Decrease pain to garden and complete job duties without pain    Pain Onset  More than a month ago         Ther-Ex - 5 min L4 for increased quad resistance - TRX single leg squat 3x 6 with demo and max cuing initially for proper form with good carry over following - Lateral step down from 3in step 3x 10 on RLE and  3x 8/6/6 on LLE; with max cuing for full hip ext with good carry over. Patient with increased rest breaks between sets following multiple demos for proper form - MATRIX hipabd4x 8 40# with min cuing for posture to prevent lateral trunk lean and for eccentric control                        PT Education - 02/24/18 1533    Education provided  Yes    Education Details  exercise form    Person(s) Educated  Patient    Methods  Explanation;Verbal cues;Demonstration    Comprehension  Verbalized understanding;Verbal cues  required;Returned demonstration       PT Short Term Goals - 01/23/18 1051      PT SHORT TERM GOAL #1   Title  Pt will be independent with HEP in order to improve strength and balance in order to increase strength and improve function at home and work    Time  4    Period  Weeks    Status  New        PT Long Term Goals - 01/23/18 1054      PT LONG TERM GOAL #1   Title  Pt will increase by at least 52m (115ft) in order to demonstrate clinically significant improvement in muscular endurance and community ambulation    Baseline  01/23/18 1216ft with gait deviation    Time  8    Status  New      PT LONG TERM GOAL #2   Title  Pt will decrease 5TSTS by at least 3 seconds in order to demonstrate clinically significant improvement in LE strength    Baseline  01/23/18 22sec with min use of hands    Time  8    Period   Weeks    Status  New      PT LONG TERM GOAL #3   Title  Pt will decrease worst pain as reported on NPRS by at least 3 points in order to demonstrate clinically significant reduction in pain to complete job duties    Baseline  01/23/18 6/10 pain with ambulation and with squatting    Time  8    Period  Weeks    Status  New      PT LONG TERM GOAL #4   Title  Patient will increase FOTO score to 62 to demonstrate predicted increase in functional mobility to complete ADLs    Baseline  01/23/18 49    Time  8    Status  New      PT LONG TERM GOAL #5   Title  Pt will increase LEFS by at least 9 points in order to demonstrate significant improvement in lower extremity function.    Baseline  01/23/18 53%    Time  8    Period  Weeks    Status  New            Plan - 02/24/18 1607    Clinical Impression Statement  PT continued therex progression, which patient was able to complete with accuracy following PT demo and cuing, and increased rest between sets of compound therex. Patient is demonstrating more difficulty with LLE today than RLE, and reports she feels "catching" in this knee.     Clinical Impairments Affecting Rehab Potential  Positive: motivation, age, partial replacement, active; Negative: history of L knee problems    PT Frequency  2x / week    PT Duration  8 weeks    PT Treatment/Interventions  Aquatic Therapy;ADLs/Self Care Home Management;Cryotherapy;Electrical Stimulation;Iontophoresis 4mg /ml Dexamethasone;Moist Heat;Ultrasound;DME Instruction;Gait training;Stair training;Functional mobility training;Therapeutic activities;Therapeutic exercise;Balance training;Neuromuscular re-education;Patient/family education;Manual techniques;Scar mobilization;Dry needling    PT Next Visit Plan  LE strengthening    PT Home Exercise Plan  mini squat, bridge with red tband, standing abd with red tband, clamshell with red tband    Consulted and Agree with Plan of Care  Patient       Patient will  benefit from skilled therapeutic intervention in order to improve the following deficits and impairments:  Abnormal gait, Decreased balance, Decreased strength, Decreased range of motion, Pain,  Improper body mechanics, Postural dysfunction, Decreased coordination, Decreased endurance, Decreased activity tolerance, Difficulty walking  Visit Diagnosis: Chronic pain of left knee     Problem List Patient Active Problem List   Diagnosis Date Noted  . Asthma, exercise induced 09/20/2014  . Overweight 09/20/2014   Staci Acosta PT, DPT Staci Acosta 02/24/2018, 4:13 PM  [place] PHYSICAL AND SPORTS MEDICINE [address] [place], [address] Phone: [phone]   Fax:  [phone]  Name: Traci Drake MRN: [id number] Date of Birth: 10/11/1955

## 2018-02-26 ENCOUNTER — Encounter: Payer: Self-pay | Admitting: Physical Therapy

## 2018-02-27 ENCOUNTER — Encounter: Payer: Self-pay | Admitting: Physical Therapy

## 2018-02-27 ENCOUNTER — Ambulatory Visit: Admitting: Physical Therapy

## 2018-02-27 DIAGNOSIS — G8929 Other chronic pain: Secondary | ICD-10-CM

## 2018-02-27 DIAGNOSIS — M25562 Pain in left knee: Secondary | ICD-10-CM | POA: Diagnosis not present

## 2018-02-27 NOTE — Therapy (Signed)
[place] [facility] PHYSICAL AND SPORTS MEDICINE [address] [place], [place], [address] Phone: [phone]   Fax:  [phone]  Physical Therapy Treatment  Patient Details  Name: Traci Drake MRN: [id number] Date of Birth: [birth year] Referring Provider: [name]   Encounter Date: 02/27/2018  PT End of Session - 02/27/18 1638    Visit Number  6    Number of Visits  17    Date for PT Re-Evaluation  03/20/18    PT Start Time  0430    PT Stop Time  0525    PT Time Calculation (min)  55 min    Activity Tolerance  Patient tolerated treatment well    Behavior During Therapy  [facility] for tasks assessed/performed       Past Medical History:  Diagnosis Date  . Decreased libido without sexual dysfunction   . Menopause     Past Surgical History:  Procedure Laterality Date  . APPENDECTOMY  1989  . BREAST BIOPSY  2003    There were no vitals filed for this visit.  Subjective Assessment - 02/27/18 1635    Subjective  Patient reports 4/10 pain in bilat knees today after being on her feet this am. Patient reports compliance with her HEP with no questions or concerns.    Pertinent History  Patient is a 62 year old female s/p R partial knee replacement 11/20/17, presenting with bilat knee OA. Patient is planning to have a L TKA, but would like to conservatively manage L if able. 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 Patient reports more pain in the am when she "gets started moving". Patient reports worst pain 6/10 in bilat knees and best 0/10. Pt denies N/V, unexplained weight fluctuation, saddle paresthesia, fever, B&B changes, night sweats, or unrelenting night pain at this time.    Limitations  Walking    How long can you sit comfortably?  30min    How long can you stand comfortably?  unlimited with pain after    How long can you walk comfortably?  unlimited with pain after    Diagnostic tests   Serial radiographs, no problems with components as reported by patient    Patient Stated Goals  Decrease pain to garden and complete job duties without pain    Pain Onset  More than a month ago       Ther-Ex - 5 minL4 for increased quad resistance - Total gym leg press L24 3x 10 with min cuing initially with good carry over following - Total gym calf raise L24 3x 10 with min cuing initially for eccentric control with good carry over following - MATRIX hipext 3x81055# with min cuing for posture - bridge on bosu ball ball 3x 7/6/ with demo and min cuing at first with good carry over following - SLR 3# ankle wt 3x 8 each side with min cuing for maintained knee ext with good carry over following                      PT Education - 02/27/18 1637    Education provided  Yes    Education Details  Exercise form    Person(s) Educated  Patient    Methods  Explanation;Verbal cues;Demonstration    Comprehension  Verbalized understanding;Verbal cues required;Returned demonstration       PT Short Term Goals - 01/23/18 1051      PT SHORT  TERM GOAL #1   Title  Pt will be independent with HEP in order to improve strength and balance in order to increase strength and improve function at home and work    Time  4    Period  Weeks    Status  New        PT Long Term Goals - 01/23/18 1054      PT LONG TERM GOAL #1   Title  Pt will increase by at least 6m (162ft) in order to demonstrate clinically significant improvement in muscular endurance and community ambulation    Baseline  01/23/18 1268ft with gait deviation    Time  8    Status  New      PT LONG TERM GOAL #2   Title  Pt will decrease 5TSTS by at least 3 seconds in order to demonstrate clinically significant improvement in LE strength    Baseline  01/23/18 22sec with min use of hands    Time  8    Period  Weeks    Status  New      PT LONG TERM GOAL #3   Title  Pt will decrease worst pain as reported on NPRS  by at least 3 points in order to demonstrate clinically significant reduction in pain to complete job duties    Baseline  01/23/18 6/10 pain with ambulation and with squatting    Time  8    Period  Weeks    Status  New      PT LONG TERM GOAL #4   Title  Patient will increase FOTO score to 62 to demonstrate predicted increase in functional mobility to complete ADLs    Baseline  01/23/18 49    Time  8    Status  New      PT LONG TERM GOAL #5   Title  Pt will increase LEFS by at least 9 points in order to demonstrate significant improvement in lower extremity function.    Baseline  01/23/18 53%    Time  8    Period  Weeks    Status  New            Plan - 02/27/18 1730    Clinical Impression Statement  PT continued therex progression with less BW therex as patient is reporting some increased soreness with standing therex today following increased shifts at work this week. Patient was able to complete all therex with accuracy following PT cuing with minimal discomfort    Rehab Potential  Good    Clinical Impairments Affecting Rehab Potential  Positive: motivation, age, partial replacement, active; Negative: history of L knee problems    PT Frequency  2x / week    PT Duration  8 weeks    PT Treatment/Interventions  Aquatic Therapy;ADLs/Self Care Home Management;Cryotherapy;Electrical Stimulation;Iontophoresis 4mg /ml Dexamethasone;Moist Heat;Ultrasound;DME Instruction;Gait training;Stair training;Functional mobility training;Therapeutic activities;Therapeutic exercise;Balance training;Neuromuscular re-education;Patient/family education;Manual techniques;Scar mobilization;Dry needling    PT Next Visit Plan  LE strengthening    PT Home Exercise Plan  mini squat, bridge with red tband, standing abd with red tband, clamshell with red tband    Consulted and Agree with Plan of Care  Patient       Patient will benefit from skilled therapeutic intervention in order to improve the following deficits  and impairments:  Abnormal gait, Decreased balance, Decreased strength, Decreased range of motion, Pain, Improper body mechanics, Postural dysfunction, Decreased coordination, Decreased endurance, Decreased activity tolerance, Difficulty walking  Visit Diagnosis: Chronic  pain of left knee     Problem List Patient Active Problem List   Diagnosis Date Noted  . Asthma, exercise induced 09/20/2014  . Overweight 09/20/2014   Staci Acosta PT, DPT Staci Acosta 02/27/2018, 5:38 PM  [place] [facility] PHYSICAL AND SPORTS MEDICINE [address] [place], [address] Phone: [phone]   Fax:  [phone]  Name: Traci Drake MRN: [id number] Date of Birth: Sep 10, 1955

## 2018-03-03 ENCOUNTER — Ambulatory Visit: Admitting: Physical Therapy

## 2018-03-03 ENCOUNTER — Encounter: Payer: Self-pay | Admitting: Physical Therapy

## 2018-03-04 ENCOUNTER — Ambulatory Visit: Admitting: Physical Therapy

## 2018-03-04 ENCOUNTER — Encounter: Payer: Self-pay | Admitting: Physical Therapy

## 2018-03-04 DIAGNOSIS — M25562 Pain in left knee: Principal | ICD-10-CM

## 2018-03-04 DIAGNOSIS — G8929 Other chronic pain: Secondary | ICD-10-CM

## 2018-03-04 NOTE — Therapy (Signed)
[place] [facility] PHYSICAL AND SPORTS MEDICINE [address] [place], [place], [address] Phone: [phone]   Fax:  [phone]  Physical Therapy Treatment  Patient Details  Name: Traci Drake MRN: [id number] Date of Birth: 07/15/1955 Referring Provider: [name]   Encounter Date: 03/04/2018  PT End of Session - 03/04/18 1427    Visit Number  7    Number of Visits  17    Date for PT Re-Evaluation  03/20/18    PT Start Time  0145    PT Stop Time  0230    PT Time Calculation (min)  45 min    Activity Tolerance  Patient tolerated treatment well    Behavior During Therapy  [facility] assessed/performed       Past Medical History:  Diagnosis Date  . Decreased libido without sexual dysfunction   . Menopause     Past Surgical History:  Procedure Laterality Date  . APPENDECTOMY  1989  . BREAST BIOPSY  2003    There were no vitals filed for this visit.  Subjective Assessment - 03/04/18 1349    Subjective  Patient reports 3/10 pain in bilat knees today, reporting she has not had to work yet today. Patient reports she feels as though her LEs are getting stronger, which she is happy with, but continues to report increased pain at work.     Pertinent History  Patient is a 62 year old female s/p R partial knee replacement 11/20/17, presenting with bilat knee OA. Patient is planning to have a L TKA, but would like to conservatively manage L if able. 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 Patient reports more pain in the am when she "gets started moving". Patient reports worst pain 6/10 in bilat knees and best 0/10. Pt denies N/V, unexplained weight fluctuation, saddle paresthesia, fever, B&B changes, night sweats, or unrelenting night pain at this time.    Limitations  Walking    How long can you sit comfortably?  30min    How long can you stand comfortably?  unlimited with pain after    How  long can you walk comfortably?  unlimited with pain after    Diagnostic tests  Serial radiographs, no problems with components as reported by patient    Patient Stated Goals  Decrease pain to garden and complete job duties without pain    Pain Onset  More than a month ago            Ther-Ex - Nustep L3 3 min; L4 3 min  - Total gym SL leg press L15 3x 01/23/09 with min cuing for eccentric control  - Total gym SL heel raise L15 3x 10 with min cuing for eccentric control  - SL ball toss at rebounder 3x 10 each leg with occasional foot down to maintain balance - Lunge with red tband giving medial resistance 3x 10 each with demo  And cuing intially for proper form with good carry over following                     PT Education - 03/04/18 1423    Education provided  Yes    Education Details  Exercise form    Person(s) Educated  Patient    Methods  Explanation;Demonstration;Verbal cues    Comprehension  Verbalized understanding;Returned demonstration;Verbal cues required       PT Short Term Goals -  01/23/18 1051      PT SHORT TERM GOAL #1   Title  Pt will be independent with HEP in order to improve strength and balance in order to increase strength and improve function at home and work    Time  4    Period  Weeks    Status  New        PT Long Term Goals - 01/23/18 1054      PT LONG TERM GOAL #1   Title  Pt will increase by at least 1m (140ft) in order to demonstrate clinically significant improvement in muscular endurance and community ambulation    Baseline  01/23/18 125ft with gait deviation    Time  8    Status  New      PT LONG TERM GOAL #2   Title  Pt will decrease 5TSTS by at least 3 seconds in order to demonstrate clinically significant improvement in LE strength    Baseline  01/23/18 22sec with min use of hands    Time  8    Period  Weeks    Status  New      PT LONG TERM GOAL #3   Title  Pt will decrease worst pain as reported on NPRS by at  least 3 points in order to demonstrate clinically significant reduction in pain to complete job duties    Baseline  01/23/18 6/10 pain with ambulation and with squatting    Time  8    Period  Weeks    Status  New      PT LONG TERM GOAL #4   Title  Patient will increase FOTO score to 62 to demonstrate predicted increase in functional mobility to complete ADLs    Baseline  01/23/18 49    Time  8    Status  New      PT LONG TERM GOAL #5   Title  Pt will increase LEFS by at least 9 points in order to demonstrate significant improvement in lower extremity function.    Baseline  01/23/18 53%    Time  8    Period  Weeks    Status  New            Plan - 03/04/18 1428    Clinical Impression Statement  PT continued therex progression with SL focus to increase resistance/work. Patient is able to complete all therex with accuracy following PT cuing with rest breaks between sets.     Rehab Potential  Good    Clinical Impairments Affecting Rehab Potential  Positive: motivation, age, partial replacement, active; Negative: history of L knee problems    PT Frequency  2x / week    PT Duration  8 weeks    PT Treatment/Interventions  Aquatic Therapy;ADLs/Self Care Home Management;Cryotherapy;Electrical Stimulation;Iontophoresis 4mg /ml Dexamethasone;Moist Heat;Ultrasound;DME Instruction;Gait training;Stair training;Functional mobility training;Therapeutic activities;Therapeutic exercise;Balance training;Neuromuscular re-education;Patient/family education;Manual techniques;Scar mobilization;Dry needling    PT Next Visit Plan  LE strengthening    PT Home Exercise Plan  mini squat, bridge with red tband, standing abd with red tband, clamshell with red tband    Consulted and Agree with Plan of Care  Patient       Patient will benefit from skilled therapeutic intervention in order to improve the following deficits and impairments:  Abnormal gait, Decreased balance, Decreased strength, Decreased range of  motion, Pain, Improper body mechanics, Postural dysfunction, Decreased coordination, Decreased endurance, Decreased activity tolerance, Difficulty walking  Visit Diagnosis: Chronic pain of left knee  Problem List Patient Active Problem List   Diagnosis Date Noted  . Asthma, exercise induced 09/20/2014  . Overweight 09/20/2014   Traci Drake PT, DPT Traci Drake 03/04/2018, 2:30 PM  [place] [facility] PHYSICAL AND SPORTS MEDICINE [address], [place], [address] Phone: [phone]   Fax:  [phone]  Name: Traci Drake MRN: [id number] Date of Birth: 03-07-1956

## 2018-03-05 ENCOUNTER — Encounter: Payer: Self-pay | Admitting: Physical Therapy

## 2018-03-06 ENCOUNTER — Ambulatory Visit: Admitting: Physical Therapy

## 2018-03-10 ENCOUNTER — Encounter: Payer: Self-pay | Admitting: Physical Therapy

## 2018-03-10 ENCOUNTER — Ambulatory Visit: Admitting: Physical Therapy

## 2018-03-10 DIAGNOSIS — G8929 Other chronic pain: Secondary | ICD-10-CM

## 2018-03-10 DIAGNOSIS — M25562 Pain in left knee: Principal | ICD-10-CM

## 2018-03-10 NOTE — Therapy (Signed)
[place] [facility]TS MEDICINE [address], [place], [address] Phone: [phone]   Fax:  [phone]  Physical Therapy Treatment  Patient Details  Name: Traci Drake MRN: [id number] Date of Birth: Nov 26, 1955 Referring Provider: Dr. Melina Fiddler   Encounter Date: 03/10/2018  PT End of Session - 03/10/18 1435    Visit Number  8    Number of Visits  17    Date for PT Re-Evaluation  03/20/18    PT Start Time  0230    PT Stop Time  0310    PT Time Calculation (min)  40 min    Activity Tolerance  Patient tolerated treatment well    Behavior During Therapy  [facility] for tasks assessed/performed       Past Medical History:  Diagnosis Date  . Decreased libido without sexual dysfunction   . Menopause     Past Surgical History:  Procedure Laterality Date  . APPENDECTOMY  1989  . BREAST BIOPSY  2003    There were no vitals filed for this visit.  Subjective Assessment - 03/10/18 1431    Subjective  Patient reports 3/10 pain following teaching this morning. Patient reports she went to the beach this weekend and had minimal pain. Patient reports compliance with HEP with no questions or concerns    Pertinent History  Patient is a 62 year old female s/p R partial knee replacement 11/20/17, presenting with bilat knee OA. Patient is planning to have a L TKA, but would like to conservatively manage L if able. 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 Patient reports more pain in the am when she "gets started moving". Patient reports worst pain 6/10 in bilat knees and best 0/10. Pt denies N/V, unexplained weight fluctuation, saddle paresthesia, fever, B&B changes, night sweats, or unrelenting night pain at this time.    Limitations  Walking    How long can you sit comfortably?     How long can you stand comfortably?  unlimited with pain after    How long can you walk comfortably?   unlimited with pain after    Diagnostic tests  Serial radiographs, no problems with components as reported by patient    Patient Stated Goals  Decrease pain to garden and complete job duties without pain    Pain Onset  More than a month ago       Ther-Ex - 6 minL5 for increased quad resistance - SL ball toss at rebounder 3x 10 each leg on airex pad with patient demonstrating good hip strategy to maintain balance with occasional foot down to maintain balance - Abd sliders 3x 10 each with demo and min cuing to maintain squat position with good carry over  - Squat thruster with bilat 5# DB 3x 10 with demo and cuing initially for proper form - Bridge on bosu ball ball 3x 8 with demo and min cuing at first with good carry over following - Prone superman with alt UE/LE lift 3x 8 each with cuing                         PT Education - 03/10/18 1435    Education provided  Yes    Education Details  Exercise form    Person(s) Educated  Patient    Methods  Explanation;Demonstration;Verbal cues    Comprehension  Verbalized understanding;Returned demonstration;Verbal cues required  PT Short Term Goals - 01/23/18 1051      PT SHORT TERM GOAL #1   Title  Pt will be independent with HEP in order to improve strength and balance in order to increase strength and improve function at home and work    Time  4    Period  Weeks    Status  New        PT Long Term Goals - 01/23/18 1054      PT LONG TERM GOAL #1   Title  Pt will increase 6MWT by at least 8067m (16864ft) in order to demonstrate clinically significant improvement in muscular endurance and community ambulation    Baseline  01/23/18 123475ft with gait deviation    Time  8    Status  New      PT LONG TERM GOAL #2   Title  Pt will decrease 5TSTS by at least 3 seconds in order to demonstrate clinically significant improvement in LE strength    Baseline  01/23/18 22sec with min use of hands    Time  8    Period  Weeks     Status  New      PT LONG TERM GOAL #3   Title  Pt will decrease worst pain as reported on NPRS by at least 3 points in order to demonstrate clinically significant reduction in pain to complete job duties    Baseline  01/23/18 6/10 pain with ambulation and with squatting    Time  8    Period  Weeks    Status  New      PT LONG TERM GOAL #4   Title  Patient will increase FOTO score to 62 to demonstrate predicted increase in functional mobility to complete ADLs    Baseline  01/23/18 49    Time  8    Status  New      PT LONG TERM GOAL #5   Title  Pt will increase LEFS by at least 9 points in order to demonstrate significant improvement in lower extremity function.    Baseline  01/23/18 53%    Time  8    Period  Weeks    Status  New            Plan - 03/10/18 1537    Clinical Impression Statement  PT continued therex progression for hip/core strengthening, which patient is able to complete correctly with min cuing from PT. PT discussed possible DC to robust HEP next week with patient. Patient reports she has seen strengthening progress in therapy but agrees she may be able to complete therex on her own, but would like to ensure confidence with proper form of therex over remaining visits.     Rehab Potential  Good    Clinical Impairments Affecting Rehab Potential  Positive: motivation, age, partial replacement, active; Negative: history of L knee problems    PT Frequency  2x / week    PT Duration  8 weeks    PT Treatment/Interventions  Aquatic Therapy;ADLs/Self Care Home Management;Cryotherapy;Electrical Stimulation;Iontophoresis 4mg /ml Dexamethasone;Moist Heat;Ultrasound;DME Instruction;Gait training;Stair training;Functional mobility training;Therapeutic activities;Therapeutic exercise;Balance training;Neuromuscular re-education;Patient/family education;Manual techniques;Scar mobilization;Dry needling    PT Next Visit Plan  LE strengthening    PT Home Exercise Plan  mini squat, bridge  with red tband, standing abd with red tband, clamshell with red tband    Consulted and Agree with Plan of Care  Patient       Patient will benefit from skilled therapeutic intervention  in order to improve the following deficits and impairments:  Abnormal gait, Decreased balance, Decreased strength, Decreased range of motion, Pain, Improper body mechanics, Postural dysfunction, Decreased coordination, Decreased endurance, Decreased activity tolerance, Difficulty walking  Visit Diagnosis: Chronic pain of left knee     Problem List Patient Active Problem List   Diagnosis Date Noted  . Asthma, exercise induced 09/20/2014  . Overweight 09/20/2014   Staci Acosta PT, DPT Staci Acosta 03/10/2018, 3:44 PM  [place] [facility] PHYSICAL AND SPORTS MEDICINE [name], [place], [address] Phone: [phone]   Fax:  [phone]  Name: Traci Drake MRN: [id number] Date of Birth: [birth year]

## 2018-03-13 ENCOUNTER — Ambulatory Visit: Admitting: Physical Therapy

## 2018-03-13 ENCOUNTER — Encounter: Payer: Self-pay | Admitting: Physical Therapy

## 2018-03-13 DIAGNOSIS — G8929 Other chronic pain: Secondary | ICD-10-CM

## 2018-03-13 DIAGNOSIS — M25562 Pain in left knee: Principal | ICD-10-CM

## 2018-03-13 NOTE — Therapy (Signed)
[facility] PHYS[facility] MEDICINE [address] [place], [address] Phone: [phone]   Fax:  [phone]  Physical Therapy Treatment  Patient Details  Name: Traci Drake MRN: [id number] Date of Birth: [birth year] Referring Provider (PT): Dr. Melina Fiddler   Encounter Date: 03/13/2018  PT End of Session - 03/13/18 1523    Visit Number  9    Number of Visits  17    Date for PT Re-Evaluation  03/20/18    PT Start Time  0315    PT Stop Time  0350    PT Time Calculation (min)  35 min    Activity Tolerance  Patient tolerated treatment well    Behavior During Therapy  [facility] for tasks assessed/performed       Past Medical History:  Diagnosis Date  . Decreased libido without sexual dysfunction   . Menopause     Past Surgical History:  Procedure Laterality Date  . APPENDECTOMY  1989  . BREAST BIOPSY  2003    There were no vitals filed for this visit.  Subjective Assessment - 03/13/18 1521    Subjective  Patient reports 3/10 pain today, but reports she knows this is normal d/t her anatomy at this time. Patient reports she believes that next week will be safe to DC to HEP. Patient has an appt for her dog and has to leave early this session.     Pertinent History  Patient is a 62 year old female s/p R partial knee replacement 11/20/17, presenting with bilat knee OA. Patient is planning to have a L TKA, but would like to conservatively manage L if able. 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 Patient reports more pain in the am when she "gets started moving". Patient reports worst pain 6/10 in bilat knees and best 0/10. Pt denies N/V, unexplained weight fluctuation, saddle paresthesia, fever, B&B changes, night sweats, or unrelenting night pain at this time.    Limitations  Walking    How long can you sit comfortably?     How long can you stand comfortably?  unlimited with  pain after    How long can you walk comfortably?  unlimited with pain after    Diagnostic tests  Serial radiographs, no problems with components as reported by patient    Patient Stated Goals  Decrease pain to garden and complete job duties without pain    Pain Onset  More than a month ago       Ther-Ex - 5 minL5 for increased quad resistance - Reverse lunge 3x 01/25/09 each with demo and min cuing initially with good carry over following - TRX SL squat 3x 8 each with good carry over from previous sessions of proper knee alignment - OMEGA leg press 75# 4x 10 with min cuing for eccentric control                         PT Education - 03/13/18 1523    Education provided  Yes    Education Details  Exercise form    Person(s) Educated  Patient    Methods  Explanation;Verbal cues;Demonstration    Comprehension  Verbalized understanding;Verbal cues required;Returned demonstration       PT Short Term Goals - 01/23/18 1051      PT SHORT TERM GOAL #1   Title  Pt will be independent with HEP  in order to improve strength and balance in order to increase strength and improve function at home and work    Time  4    Period  Weeks    Status  New        PT Long Term Goals - 01/23/18 1054      PT LONG TERM GOAL #1   Title  Pt will increase by at least 29m (141ft) in order to demonstrate clinically significant improvement in muscular endurance and community ambulation    Baseline  01/23/18 1294ft with gait deviation    Time  8    Status  New      PT LONG TERM GOAL #2   Title  Pt will decrease 5TSTS by at least 3 seconds in order to demonstrate clinically significant improvement in LE strength    Baseline  01/23/18 22sec with min use of hands    Time  8    Period  Weeks    Status  New      PT LONG TERM GOAL #3   Title  Pt will decrease worst pain as reported on NPRS by at least 3 points in order to demonstrate clinically significant reduction in pain to complete  job duties    Baseline  01/23/18 6/10 pain with ambulation and with squatting    Time  8    Period  Weeks    Status  New      PT LONG TERM GOAL #4   Title  Patient will increase FOTO score to 62 to demonstrate predicted increase in functional mobility to complete ADLs    Baseline  01/23/18 49    Time  8    Status  New      PT LONG TERM GOAL #5   Title  Pt will increase LEFS by at least 9 points in order to demonstrate significant improvement in lower extremity function.    Baseline  01/23/18 53%    Time  8    Period  Weeks    Status  New            Plan - 03/13/18 1533    Clinical Impression Statement  PT shortened session accordingly d/t patient appt schedule. PT was able to increase intensity of therex with patient reporting no increased pain with this. Next session PT will re-assess goals and ensure patient safety in continuing maintainence on her own in her local gym.     Rehab Potential  Good    Clinical Impairments Affecting Rehab Potential  Positive: motivation, age, partial replacement, active; Negative: history of L knee problems    PT Frequency  2x / week    PT Duration  8 weeks    PT Treatment/Interventions  Aquatic Therapy;ADLs/Self Care Home Management;Cryotherapy;Electrical Stimulation;Iontophoresis 4mg /ml Dexamethasone;Moist Heat;Ultrasound;DME Instruction;Gait training;Stair training;Functional mobility training;Therapeutic activities;Therapeutic exercise;Balance training;Neuromuscular re-education;Patient/family education;Manual techniques;Scar mobilization;Dry needling    PT Next Visit Plan  DC    PT Home Exercise Plan  mini squat, bridge with red tband, standing abd with red tband, clamshell with red tband    Consulted and Agree with Plan of Care  Patient       Patient will benefit from skilled therapeutic intervention in order to improve the following deficits and impairments:  Abnormal gait, Decreased balance, Decreased strength, Decreased range of motion, Pain,  Improper body mechanics, Postural dysfunction, Decreased coordination, Decreased endurance, Decreased activity tolerance, Difficulty walking  Visit Diagnosis: Chronic pain of left knee     Problem List  Patient Active Problem List   Diagnosis Date Noted  . Asthma, exercise induced 09/20/2014  . Overweight 09/20/2014   Staci Acosta PT, DPT Staci Acosta 03/13/2018, 3:48 PM  [facility] PHYSICAL AND SPORTS MEDICINE [address] [place], [address] Phone: [phone]   Fax:  [phone]  Name: Traci Drake MRN: [id number] Date of Birth: Nov 12, 1955

## 2018-03-17 ENCOUNTER — Ambulatory Visit: Admitting: Physical Therapy

## 2018-03-17 ENCOUNTER — Encounter: Payer: Self-pay | Admitting: Physical Therapy

## 2018-03-17 DIAGNOSIS — G8929 Other chronic pain: Secondary | ICD-10-CM

## 2018-03-17 DIAGNOSIS — M25562 Pain in left knee: Secondary | ICD-10-CM | POA: Diagnosis not present

## 2018-03-17 NOTE — Therapy (Signed)
[facility] [address] [place], [address] Phone: [phone]   Fax:  [phone]  Physical Therapy Treatment  Patient Details  Name: Traci Drake MRN: [id number] Date of Birth: 1956/01/31 Referring Provider (PT): Dr. Melina Fiddler   Encounter Date: 03/17/2018  PT End of Session - 03/17/18 1534    Visit Number  10    Number of Visits  17    Date for PT Re-Evaluation  03/20/18    PT Start Time  0330    PT Stop Time  0430    PT Time Calculation (min)  60 min    Activity Tolerance  Patient tolerated treatment well    Behavior During Therapy  [facility] for tasks assessed/performed       Past Medical History:  Diagnosis Date  . Decreased libido without sexual dysfunction   . Menopause     Past Surgical History:  Procedure Laterality Date  . APPENDECTOMY  1989  . BREAST BIOPSY  2003    There were no vitals filed for this visit.  Subjective Assessment - 03/17/18 1530    Subjective  Patient reports 2/10 pain today. Patient reports following last session in the medial knee that was relieved with icing and rest. Patient reports this pain increased to 7/10 over the weekend but subsided in 48 hours. Patient reports compliance with HEP with no questions or concerns.      Pertinent History  Patient is a 62 year old female s/p R partial knee replacement 11/20/17, presenting with bilat knee OA. Patient is planning to have a L TKA, but would like to conservatively manage L if able. 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 Patient reports more pain in the am when she "gets started moving". Patient reports worst pain 6/10 in bilat knees and best 0/10. Pt denies N/V, unexplained weight fluctuation, saddle paresthesia, fever, B&B changes, night sweats, or unrelenting night pain at this time.    Limitations  Walking    How long can you sit comfortably?     How long can  you stand comfortably?  unlimited with pain after    How long can you walk comfortably?  unlimited with pain after    Diagnostic tests  Serial radiographs, no problems with components as reported by patient    Patient Stated Goals  Decrease pain to garden and complete job duties without pain    Pain Onset  More than a month ago          Ther-Ex - where patient is able to ambulate ft with 0/10 pain following. Patient requires some cuing for full L knee ext, and reports some "tightness in the L glute" immediately following test - 2 trials of 5x STS test with best time 10.8sec - TKE green tband 4x 10 - Prone hip ext with bent knee x10 each LE; wih 2# ankle wt 2x 10 with min cuing to prevent hip rotation compensation - PT completed FOTO and LEFS with patient to ensure completion of goals and that patient is able to complete job duties. Patient understands some pain is to be expected with dx and reports she feels ind in HEP and work duties to maintain manageable pain.  - Gait following with patient able to complete ambulation with full knee ext and even step length/stance time, indicating this is more of a "habit" issue than strength  PT Education - 03/17/18 1534    Education provided  Yes    Education Details  Exercise form; inflammation educaiton    Person(s) Educated  Patient    Methods  Explanation;Verbal cues    Comprehension  Verbalized understanding;Verbal cues required       PT Short Term Goals - 03/17/18 1546      PT SHORT TERM GOAL #1   Title  Pt will be independent with HEP in order to improve strength and balance in order to increase strength and improve function at home and work    Time  4    Period  Weeks    Status  Achieved        PT Long Term Goals - 03/17/18 1538      PT LONG TERM GOAL #1   Title  Pt will increase by at least 62m (176ft) in order to demonstrate clinically significant improvement in muscular endurance  and community ambulation    Baseline  03/12/18 1623ft with slight gait deviation    Status  Achieved      PT LONG TERM GOAL #2   Title  Pt will decrease 5TSTS by at least 3 seconds in order to demonstrate clinically significant improvement in LE strength    Baseline  03/12/18 10.8sec with no use of hands    Time  8    Period  Weeks    Status  Achieved      PT LONG TERM GOAL #3   Title  Pt will decrease worst pain as reported on NPRS by at least 3 points in order to demonstrate clinically significant reduction in pain to complete job duties    Baseline  03/12/18 3/10 pain baseline, other than 7/10 over the weekend following exercise    Time  8    Period  Weeks    Status  Achieved      PT LONG TERM GOAL #4   Title  Patient will increase FOTO score to 62 to demonstrate predicted increase in functional mobility to complete ADLs    Baseline  03/12/18 62    Time  8    Period  Weeks    Status  Achieved      PT LONG TERM GOAL #5   Title  Pt will increase LEFS by at least 9 points in order to demonstrate significant improvement in lower extremity function.    Baseline  03/12/18    Time  8    Period  Weeks    Status  Achieved              Patient will benefit from skilled therapeutic intervention in order to improve the following deficits and impairments:     Visit Diagnosis: Chronic pain of left knee     Problem List Patient Active Problem List   Diagnosis Date Noted  . Asthma, exercise induced 09/20/2014  . Overweight 09/20/2014   Staci Acosta PT, DPT Staci Acosta 03/17/2018, 4:14 PM  [facility] PHYSICAL AND SPORTS MEDICINE [address], [place], [address] Phone: [phone]   Fax:  [phone]  Name: SERINA NICHTER MRN: [id number] Date of Birth: 05/05/1956

## 2018-03-20 ENCOUNTER — Ambulatory Visit: Admitting: Physical Therapy

## 2018-03-24 ENCOUNTER — Ambulatory Visit: Admitting: Physical Therapy

## 2018-03-27 ENCOUNTER — Encounter: Admitting: Physical Therapy

## 2018-03-31 ENCOUNTER — Encounter: Admitting: Physical Therapy

## 2018-04-03 ENCOUNTER — Encounter: Admitting: Physical Therapy

## 2018-04-07 ENCOUNTER — Encounter: Admitting: Physical Therapy

## 2018-04-10 ENCOUNTER — Encounter: Admitting: Physical Therapy

## 2018-04-14 ENCOUNTER — Encounter: Admitting: Physical Therapy

## 2018-04-17 ENCOUNTER — Encounter: Payer: Self-pay | Admitting: Physical Therapy

## 2018-11-03 ENCOUNTER — Inpatient Hospital Stay: Admit: 2018-11-03 | Admitting: Orthopedic Surgery

## 2018-11-03 SURGERY — ARTHROPLASTY, KNEE, TOTAL
Anesthesia: Choice | Laterality: Left

## 2018-11-25 NOTE — Patient Instructions (Addendum)
Traci Drake    Your procedure is scheduled on: 12-08-2018   [facility]  [facility]ance Report to admitting at 600 AM   YOU NEED TO HAVE A COVID 19 TEST ON_6/18 Thurs______ @__12 :30_____, THIS TEST MUST BE DONE BEFORE SURGERY, [facility].    Call this number if you have problems the morning of surgery [phone]    Remember: . BRUSH YOUR TEETH MORNING OF SURGERY AND RINSE YOUR MOUTH OUT, NO CHEWING GUM CANDY OR MINTS.   NO SOLID FOOD AFTER MIDNIGHT THE NIGHT PRIOR TO SURGERY          . NOTHING BY MOUTH EXCEPT CLEAR LIQUIDS UNTIL 430 AM.         PLEASE FINISH ENSURE DRINK PER SURGEON ORDER  WHICH NEEDS TO BE COMPLETED AT 430 AM.    CLEAR LIQUID DIET   Foods Allowed                                                                     Foods Excluded  Coffee and tea, regular and decaf                             liquids that you cannot  Plain Jell-O in any flavor                                             see through such as: Fruit ices (not with fruit pulp)                                     milk, soups, orange juice  Iced Popsicles                                    All solid food Carbonated beverages, regular and diet                                    Cranberry, grape and apple juices Sports drinks like Gatorade Lightly seasoned clear broth or consume(fat free) Sugar, honey syrup  Sample Menu Breakfast                                Lunch                                     Supper Cranberry juice                    Beef broth  Chicken broth Jell-O                                     Grape juice                           Apple juice Coffee or tea                        Jell-O                                      Popsicle                                                Coffee or tea                        Coffee or  tea  _____________________________________________________________________     Take these medicines the morning of surgery with A SIP OF WATER: ALBUTEROL INHALER IF NEEDED AND BRING INHALER,                  GABAPENTIN (NEURONTIN)                                You may not have any metal on your body including hair pins and              piercings  Do not wear jewelry, make-up, lotions, powders or perfumes, deodorant             Do not wear nail polish.  Do not shave  48 hours prior to surgery.              Men may shave face and neck.   Do not bring valuables to the hospital. [place] [facility] IS NOT             RESPONSIBLE   FOR VALUABLES.  Contacts, dentures or bridgework may not be worn into surgery.  Leave suitcase in the car. After surgery it may be brought to your room.     _____________________________________________________________________             [facility]  Before surgery, you can play an important role.   Because skin is not sterile, your skin needs to be as free of germs as possible.   You can reduce the number of germs on your skin by washing with CHG (chlorahexidine gluconate) soap before surgery.  CHG is an antiseptic cleaner which kills germs and bonds with the skin to continue killing germs even after washing. Please DO NOT use if you have an allergy to CHG or antibacterial soaps.  If your skin becomes reddened/irritated stop using the CHG and inform your nurse when you arrive at Short Stay. Do not shave (including legs and underarms) for at least 48 hours prior to the first CHG shower.  You may shave your face/neck.  Please follow these instructions carefully:  1.  Shower with CHG Soap the night before surgery and the  morning of Surgery.  2.  If you choose to wash your hair, wash your hair first as usual with your  normal  shampoo.  3.  After you shampoo, rinse your hair and body thoroughly to remove the  shampoo.                                        4.  Use CHG as you would any other liquid soap.  You can apply chg directly  to the skin and wash                       Gently with a scrungie or clean washcloth.  5.  Apply the CHG Soap to your body ONLY FROM THE NECK DOWN.   Do not use on face/ open                           Wound or open sores. Avoid contact with eyes, ears mouth and genitals (private parts).                       Wash face,  Genitals (private parts) with your normal soap.             6.  Wash thoroughly, paying special attention to the area where your surgery  will be performed.  7.  Thoroughly rinse your body with warm water from the neck down.  8.  DO NOT shower/wash with your normal soap after using and rinsing off  the CHG Soap.                9.  Pat yourself dry with a clean towel.            10.  Wear clean pajamas.            11.  Place clean sheets on your bed the night of your first shower and do not  sleep with pets.  Day of Surgery : Do not apply any lotions/deodorants the morning of surgery.  Please wear clean clothes to the hospital/surgery center.  FAILURE TO FOLLOW THESE INSTRUCTIONS MAY RESULT IN THE CANCELLATION OF YOUR SURGERY PATIENT SIGNATURE_________________________________  NURSE SIGNATURE__________________________________  ________________________________________________________________________   Traci MireIncentive Spirometer  An incentive spirometer is a tool that can help keep your lungs clear and active. This tool measures how well you are filling your lungs with each breath. Taking long deep breaths may help reverse or decrease the chance of developing breathing (pulmonary) problems (especially infection) following:  A long period of time when you are unable to move or be active. BEFORE THE PROCEDURE   If the spirometer includes an indicator to show your best effort, your nurse or respiratory therapist will set it to a desired goal.  If possible, sit up straight or lean slightly  forward. Try not to slouch.  Hold the incentive spirometer in an upright position. INSTRUCTIONS FOR USE  1. Sit on the edge of your bed if possible, or sit up as far as you can in bed or on a chair. 2. Hold the incentive spirometer in an upright position. 3. Breathe out normally. 4. Place the mouthpiece in your mouth and seal your lips tightly around it. 5. Breathe in slowly and as deeply as possible, raising the piston or the ball toward the top of the column. 6. Hold your  breath for 3-5 seconds or for as long as possible. Allow the piston or ball to fall to the bottom of the column. 7. Remove the mouthpiece from your mouth and breathe out normally. 8. Rest for a few seconds and repeat Steps 1 through 7 at least 10 times every 1-2 hours when you are awake. Take your time and take a few normal breaths between deep breaths. 9. The spirometer may include an indicator to show your best effort. Use the indicator as a goal to work toward during each repetition. 10. After each set of 10 deep breaths, practice coughing to be sure your lungs are clear. If you have an incision (the cut made at the time of surgery), support your incision when coughing by placing a pillow or rolled up towels firmly against it. Once you are able to get out of bed, walk around indoors and cough well. You may stop using the incentive spirometer when instructed by your caregiver.  RISKS AND COMPLICATIONS  Take your time so you do not get dizzy or light-headed.  If you are in pain, you may need to take or ask for pain medication before doing incentive spirometry. It is harder to take a deep breath if you are having pain. AFTER USE  Rest and breathe slowly and easily.  It can be helpful to keep track of a log of your progress. Your caregiver can provide you with a simple table to help with this. If you are using the spirometer at home, follow these instructions: [place] IF:   You are having difficultly using the  spirometer.  You have trouble using the spirometer as often as instructed.  Your pain medication is not giving enough relief while using the spirometer.  You develop fever of 100.5 F (38.1 C) or higher. SEEK IMMEDIATE MEDICAL CARE IF:   You cough up bloody sputum that had not been present before.  You develop fever of 102 F (38.9 C) or greater.  You develop worsening pain at or near the incision site. MAKE SURE YOU:   Understand these instructions.  Will watch your condition.  Will get help right away if you are not doing well or get worse. Document Released: 10/15/2006 Document Revised: 08/27/2011 Document Reviewed: 12/16/2006 ExitCare Patient Information 2014 ExitCare, [place].   ________________________________________________________________________  WHAT IS A BLOOD TRANSFUSION? Blood Transfusion Information  A transfusion is the replacement of blood or some of its parts. Blood is made up of multiple cells which provide different functions.  Red blood cells carry oxygen and are used for blood loss replacement.  White blood cells fight against infection.  Platelets control bleeding.  Plasma helps clot blood.  Other blood products are available for specialized needs, such as hemophilia or other clotting disorders. BEFORE THE TRANSFUSION  Who gives blood for transfusions?   Healthy volunteers who are fully evaluated to make sure their blood is safe. This is blood bank blood. Transfusion therapy is the safest it has ever been in the practice of medicine. Before blood is taken from a donor, a complete history is taken to make sure that person has no history of diseases nor engages in risky social behavior (examples are intravenous drug use or sexual activity with multiple partners). The donor's travel history is screened to minimize risk of transmitting infections, such as malaria. The donated blood is tested for signs of infectious diseases, such as HIV and hepatitis.  The blood is then tested to be sure it is compatible with  you in order to minimize the chance of a transfusion reaction. If you or a relative donates blood, this is often done in anticipation of surgery and is not appropriate for emergency situations. It takes many days to process the donated blood. RISKS AND COMPLICATIONS Although transfusion therapy is very safe and saves many lives, the main dangers of transfusion include:   Getting an infectious disease.  Developing a transfusion reaction. This is an allergic reaction to something in the blood you were given. Every precaution is taken to prevent this. The decision to have a blood transfusion has been considered carefully by your caregiver before blood is given. Blood is not given unless the benefits outweigh the risks. AFTER THE TRANSFUSION  Right after receiving a blood transfusion, you will usually feel much better and more energetic. This is especially true if your red blood cells have gotten low (anemic). The transfusion raises the level of the red blood cells which carry oxygen, and this usually causes an energy increase.  The nurse administering the transfusion will monitor you carefully for complications. HOME CARE INSTRUCTIONS  No special instructions are needed after a transfusion. You may find your energy is better. Speak with your caregiver about any limitations on activity for underlying diseases you may have. SEEK MEDICAL CARE IF:   Your condition is not improving after your transfusion.  You develop redness or irritation at the intravenous (IV) site. SEEK IMMEDIATE MEDICAL CARE IF:  Any of the following symptoms occur over the next 12 hours:  Shaking chills.  You have a temperature by mouth above 102 F (38.9 C), not controlled by medicine.  Chest, back, or muscle pain.  People around you feel you are not acting correctly or are confused.  Shortness of breath or difficulty breathing.  Dizziness and fainting.  You  get a rash or develop hives.  You have a decrease in urine output.  Your urine turns a dark color or changes to pink, red, or brown. Any of the following symptoms occur over the next 10 days:  You have a temperature by mouth above 102 F (38.9 C), not controlled by medicine.  Shortness of breath.  Weakness after normal activity.  The white part of the eye turns yellow (jaundice).  You have a decrease in the amount of urine or are urinating less often.  Your urine turns a dark color or changes to pink, red, or brown. Document Released: 06/01/2000 Document Revised: 08/27/2011 Document Reviewed: 01/19/2008 [facility] Patient Information 2014 [place], [place].  _______________________________________________________________________

## 2018-11-26 ENCOUNTER — Encounter (HOSPITAL_COMMUNITY)
Admission: RE | Admit: 2018-11-26 | Discharge: 2018-11-26 | Disposition: A | Discharge status: Home / Self Care | Source: Ambulatory Visit | Attending: Orthopedic Surgery | Admitting: Orthopedic Surgery

## 2018-11-26 ENCOUNTER — Encounter (HOSPITAL_COMMUNITY): Payer: Self-pay

## 2018-11-26 ENCOUNTER — Other Ambulatory Visit: Payer: Self-pay

## 2018-11-26 DIAGNOSIS — Z01812 Encounter for preprocedural laboratory examination: Secondary | ICD-10-CM | POA: Insufficient documentation

## 2018-11-26 DIAGNOSIS — M1712 Unilateral primary osteoarthritis, left knee: Secondary | ICD-10-CM | POA: Insufficient documentation

## 2018-11-26 HISTORY — DX: Unspecified asthma, uncomplicated: J45.909

## 2018-11-26 HISTORY — DX: Other complications of anesthesia, initial encounter: T88.59XA

## 2018-11-26 LAB — COMPREHENSIVE METABOLIC PANEL
ALT: 23 U/L (ref 0–44)
AST: 23 U/L (ref 15–41)
Albumin: 4.1 g/dL (ref 3.5–5.0)
Alkaline Phosphatase: 166 U/L — ABNORMAL HIGH (ref 38–126)
Anion gap: 9 (ref 5–15)
BUN: 18 mg/dL (ref 8–23)
CO2: 23 mmol/L (ref 22–32)
Calcium: 9 mg/dL (ref 8.9–10.3)
Chloride: 106 mmol/L (ref 98–111)
Creatinine, Ser: 0.79 mg/dL (ref 0.44–1.00)
GFR calc Af Amer: >60 mL/min (ref >60)
GFR calc non Af Amer: >60 mL/min (ref >60)
Glucose, Bld: 90 mg/dL (ref 70–99)
Potassium: 4.7 mmol/L (ref 3.5–5.1)
Sodium: 138 mmol/L (ref 135–145)
Total Bilirubin: 0.4 mg/dL (ref 0.3–1.2)
Total Protein: 7.2 g/dL (ref 6.5–8.1)

## 2018-11-26 LAB — CBC
HCT: 46.5 % — ABNORMAL HIGH (ref 36.0–46.0)
Hemoglobin: 15 g/dL (ref 12.0–15.0)
MCH: 30.9 pg (ref 26.0–34.0)
MCHC: 32.3 g/dL (ref 30.0–36.0)
MCV: 95.9 fL (ref 80.0–100.0)
Platelets: 242 10*3/uL (ref 150–400)
RBC: 4.85 MIL/uL (ref 3.87–5.11)
RDW: 12.1 % (ref 11.5–15.5)
WBC: 4.6 10*3/uL (ref 4.0–10.5)
nRBC: 0 % (ref 0.0–0.2)

## 2018-11-26 LAB — PROTIME-INR
INR: 0.9 (ref 0.8–1.2)
Prothrombin Time: 11.6 seconds (ref 11.4–15.2)

## 2018-11-26 LAB — APTT: aPTT: 29 seconds (ref 24–36)

## 2018-11-26 LAB — SURGICAL PCR SCREEN
MRSA, PCR: NEGATIVE
Staphylococcus aureus: NEGATIVE

## 2018-11-26 NOTE — Progress Notes (Addendum)
Traci Felix   PA   Pt will call her MD in [facility] for EKG.  [employer].

## 2018-12-01 NOTE — H&P (Addendum)
TOTAL KNEE ADMISSION H&P  Patient is being admitted for left total knee arthroplasty.  Subjective:  Chief Complaint:left knee pain.  HPI: Traci Drake, 63 y.o. female, has a history of pain and functional disability in the left knee due to arthritis and has failed non-surgical conservative treatments for greater than 12 weeks to includecorticosteriod injections, viscosupplementation injections and activity modification.  Onset of symptoms was gradual, starting >10 years ago with gradually worsening course since that time. The patient noted prior procedures on the knee to include  arthroscopy and menisectomy on the left knee(s).  Patient currently rates pain in the left knee(s) at 8 out of 10 with activity. Patient has worsening of pain with activity and weight bearing.  Patient has evidence of  bone-on-bone arthritis medially as well as significant patellofemoral narrowing and spurring by imaging studies. There is no active infection.  Patient Active Problem List   Diagnosis Date Noted  . Asthma, exercise induced 09/20/2014  . Overweight 09/20/2014   Past Medical History:  Diagnosis Date  . Asthma    seasonal  . Complication of anesthesia    needed albumin in PACU. and had vertigo  . Decreased libido without sexual dysfunction   . Menopause     Past Surgical History:  Procedure Laterality Date  . APPENDECTOMY  1989  . BREAST BIOPSY  2003  . KNEE ARTHROSCOPY Left    6-8 years ago    No current facility-administered medications for this encounter.    Current Outpatient Medications  Medication Sig Dispense Refill Last Dose  . albuterol (VENTOLIN HFA) 108 (90 Base) MCG/ACT inhaler Inhale 2 puffs into the lungs every 6 (six) hours as needed for wheezing or shortness of breath.     . gabapentin (NEURONTIN) 300 MG capsule Take 300-600 mg by mouth See admin instructions. Take 300 mg in the morning and 600 mg at night     . ibuprofen (ADVIL) 800 MG tablet Take 800 mg by mouth daily  as needed for headache or moderate pain.     . meloxicam (MOBIC) 15 MG tablet Take 15 mg by mouth daily as needed for pain.       Allergies  Allergen Reactions  . Hydrocodone-Acetaminophen Hives  . Kiwi Extract Anaphylaxis  . Erythromycin Base Swelling    Tolerates po, reaction was caused by an eye ointment   . Tetracycline Swelling and Rash    Social History   Tobacco Use  . Smoking status: Never Smoker  . Smokeless tobacco: Never Used  Substance Use Topics  . Alcohol use: Yes    Comment: occasional     No family history on file.   ROS Constitutional: Constitutional: no fever, chills, night sweats, or significant weight loss. Cardiovascular: Cardiovascular: no palpitations or chest pain Respiratory: Respiratory: no cough or shortness of breath and No COPD. Gastrointestinal: Gastrointestinal: no vomiting or nausea. Musculoskeletal: Musculoskeletal: no swelling in Joints and Joint Pain. Neurologic: Neurologic: no numbness, tingling, or difficulty with balance. Objective:  Physical Exam Patient is a 63 year old female.  Well nourished and well developed. General: Alert and oriented x3, cooperative and pleasant, no acute distress. Head: normocephalic, atraumatic, neck supple. Eyes: EOMI. Respiratory: breath sounds clear in all fields, no wheezing, rales, or rhonchi. Cardiovascular: Regular rate and rhythm, no murmurs, gallops or rubs. Abdomen: non-tender to palpation and soft, normoactive bowel sounds. Musculoskeletal:  Left Knee Exam: No effusion. Range of motion is 0-130 degrees. Moderate crepitus on range of motion of the knee. Positive medial joint line  tenderness. No lateral joint line tenderness. Stable knee.  Calves soft and nontender. Motor function intact in LE. Strength 5/5 LE bilaterally. Neuro: Distal pulses 2+. Sensation to light touch intact in LE.  Vital signs in last 24 hours:  BP: 144/84 mmHg Pulse: 80 bpm  Labs:   Estimated body mass index  is 28.29 kg/m as calculated from the following:   Height as of 11/26/18: 5\' 5"  (1.651 m).   Weight as of 11/26/18: 77.1 kg.   Imaging Review Plain radiographs demonstrate severe degenerative joint disease of the left knee(s). The overall alignment isneutral. The bone quality appears to be adequate for age and reported activity level.      Assessment/Plan:  End stage arthritis, left knee   The patient history, physical examination, clinical judgment of the provider and imaging studies are consistent with end stage degenerative joint disease of the left knee(s) and total knee arthroplasty is deemed medically necessary. The treatment options including medical management, injection therapy arthroscopy and arthroplasty were discussed at length. The risks and benefits of total knee arthroplasty were presented and reviewed. The risks due to aseptic loosening, infection, stiffness, patella tracking problems, thromboembolic complications and other imponderables were discussed. The patient acknowledged the explanation, agreed to proceed with the plan and consent was signed. Patient is being admitted for inpatient treatment for surgery, pain control, PT, OT, prophylactic antibiotics, VTE prophylaxis, progressive ambulation and ADL's and discharge planning. The patient is planning to be discharged home.  Therapy Plans: Outpatient therapy at [facility] in [place] Disposition: Home with husband Planned DVT Prophylaxis: Aspirin 325 mg BID DME needed: None PCP: Cephas Darby, MD TXA: IV Allergies: Erythromycin (topical - swelling) Anesthesia Concerns: Vertigo with previous right uni knee BMI: 28.3   Patient's anticipated LOS is less than 2 midnights, meeting these requirements: - Younger than 14 - Lives within 1 hour of care - Has a competent adult at home to recover with post-op recover - NO history of  - Chronic pain requiring opiods  - Diabetes  - Coronary Artery Disease  - Heart failure  -  Heart attack  - Stroke  - DVT/VTE  - Cardiac arrhythmia  - Respiratory Failure/COPD  - Renal failure  - Anemia  - Advanced Liver disease   - Patient was instructed on what medications to stop prior to surgery. - Follow-up visit in 2 weeks with Dr. Wynelle Link - Begin physical therapy following surgery - Pre-operative lab work as pre-surgical testing - Prescriptions will be provided in hospital at time of discharge  Griffith Citron, PA-C Orthopedic Surgery [facility] [place]

## 2018-12-04 ENCOUNTER — Other Ambulatory Visit (HOSPITAL_COMMUNITY)
Admission: RE | Admit: 2018-12-04 | Discharge: 2018-12-04 | Disposition: A | Discharge status: Home / Self Care | Source: Ambulatory Visit | Attending: Orthopedic Surgery | Admitting: Orthopedic Surgery

## 2018-12-04 DIAGNOSIS — Z1159 Encounter for screening for other viral diseases: Secondary | ICD-10-CM | POA: Diagnosis present

## 2018-12-04 LAB — SARS CORONAVIRUS 2 (TAT 6-24 HRS): SARS Coronavirus 2: NEGATIVE

## 2018-12-07 MED ORDER — BUPIVACAINE LIPOSOME 1.3 % IJ SUSP
20.0000 mL | Freq: Once | INTRAMUSCULAR | Status: DC
  Filled 2018-12-07: qty 20

## 2018-12-07 NOTE — Anesthesia Preprocedure Evaluation (Addendum)
Anesthesia Evaluation  Patient identified by MRN, date of birth, ID band Patient awake    Reviewed: Allergy & Precautions, NPO status , Patient's Chart, lab work & pertinent test results  Airway Mallampati: II  TM Distance: >3 FB Neck ROM: Full    Dental no notable dental hx.    Pulmonary asthma ,    Pulmonary exam normal breath sounds clear to auscultation       Cardiovascular negative cardio ROS Normal cardiovascular exam Rhythm:Regular Rate:Normal     Neuro/Psych vertigo negative psych ROS   GI/Hepatic negative GI ROS, Neg liver ROS,   Endo/Other  negative endocrine ROS  Renal/GU negative Renal ROS     Musculoskeletal negative musculoskeletal ROS (+)   Abdominal   Peds  Hematology negative hematology ROS (+)   Anesthesia Other Findings left knee osteoarthritis  Reproductive/Obstetrics                           Anesthesia Physical Anesthesia Plan  ASA: II  Anesthesia Plan: Spinal and Regional   Post-op Pain Management:    Induction:   PONV Risk Score and Plan: 2 and Ondansetron, Dexamethasone, Propofol infusion, Treatment may vary due to age or medical condition and Midazolam  Airway Management Planned: Simple Face Mask  Additional Equipment:   Intra-op Plan:   Post-operative Plan:   Informed Consent: I have reviewed the patients History and Physical, chart, labs and discussed the procedure including the risks, benefits and alternatives for the proposed anesthesia with the patient or authorized representative who has indicated his/her understanding and acceptance.     Dental advisory given  Plan Discussed with: CRNA  Anesthesia Plan Comments:        Anesthesia Quick Evaluation

## 2018-12-08 ENCOUNTER — Other Ambulatory Visit: Payer: Self-pay

## 2018-12-08 ENCOUNTER — Encounter (HOSPITAL_COMMUNITY): Payer: Self-pay | Admitting: Certified Registered Nurse Anesthetist

## 2018-12-08 ENCOUNTER — Encounter (HOSPITAL_COMMUNITY)
Admission: RE | Disposition: A | Discharge status: Home / Self Care | Payer: Self-pay | Source: Home / Self Care | Attending: Orthopedic Surgery

## 2018-12-08 ENCOUNTER — Inpatient Hospital Stay (HOSPITAL_COMMUNITY)
Admission: RE | Admit: 2018-12-08 | Discharge: 2018-12-09 | DRG: 470 | Disposition: A | Discharge status: Home / Self Care | Attending: Orthopedic Surgery | Admitting: Orthopedic Surgery

## 2018-12-08 ENCOUNTER — Inpatient Hospital Stay (HOSPITAL_COMMUNITY): Admitting: Certified Registered Nurse Anesthetist

## 2018-12-08 ENCOUNTER — Inpatient Hospital Stay (HOSPITAL_COMMUNITY): Admitting: Physician Assistant

## 2018-12-08 DIAGNOSIS — Z791 Long term (current) use of non-steroidal anti-inflammatories (NSAID): Secondary | ICD-10-CM | POA: Diagnosis not present

## 2018-12-08 DIAGNOSIS — E663 Overweight: Secondary | ICD-10-CM | POA: Diagnosis present

## 2018-12-08 DIAGNOSIS — Z780 Asymptomatic menopausal state: Secondary | ICD-10-CM | POA: Diagnosis not present

## 2018-12-08 DIAGNOSIS — Z881 Allergy status to other antibiotic agents status: Secondary | ICD-10-CM | POA: Diagnosis not present

## 2018-12-08 DIAGNOSIS — M25562 Pain in left knee: Secondary | ICD-10-CM | POA: Diagnosis present

## 2018-12-08 DIAGNOSIS — M25762 Osteophyte, left knee: Secondary | ICD-10-CM | POA: Diagnosis present

## 2018-12-08 DIAGNOSIS — M1712 Unilateral primary osteoarthritis, left knee: Principal | ICD-10-CM | POA: Diagnosis present

## 2018-12-08 DIAGNOSIS — Z91018 Allergy to other foods: Secondary | ICD-10-CM | POA: Diagnosis not present

## 2018-12-08 DIAGNOSIS — M179 Osteoarthritis of knee, unspecified: Secondary | ICD-10-CM

## 2018-12-08 DIAGNOSIS — Z6828 Body mass index (BMI) 28.0-28.9, adult: Secondary | ICD-10-CM | POA: Diagnosis not present

## 2018-12-08 DIAGNOSIS — Z885 Allergy status to narcotic agent status: Secondary | ICD-10-CM | POA: Diagnosis not present

## 2018-12-08 DIAGNOSIS — J45998 Other asthma: Secondary | ICD-10-CM | POA: Diagnosis present

## 2018-12-08 DIAGNOSIS — T8482XA Fibrosis due to internal orthopedic prosthetic devices, implants and grafts, initial encounter: Secondary | ICD-10-CM | POA: Diagnosis present

## 2018-12-08 DIAGNOSIS — M1710 Unilateral primary osteoarthritis, unspecified knee: Secondary | ICD-10-CM

## 2018-12-08 DIAGNOSIS — Z79899 Other long term (current) drug therapy: Secondary | ICD-10-CM

## 2018-12-08 HISTORY — PX: TOTAL KNEE ARTHROPLASTY: SHX125

## 2018-12-08 LAB — TYPE AND SCREEN
ABO/RH(D): O POS
Antibody Screen: NEGATIVE

## 2018-12-08 LAB — ABO/RH: ABO/RH(D): O POS

## 2018-12-08 SURGERY — ARTHROPLASTY, KNEE, TOTAL
Anesthesia: Regional | Laterality: Left

## 2018-12-08 MED ORDER — SODIUM CHLORIDE 0.9 % IV SOLN
INTRAVENOUS | Status: DC
  Administered 2018-12-08: 12:00:00 via INTRAVENOUS

## 2018-12-08 MED ORDER — MEPERIDINE HCL 50 MG/ML IJ SOLN
6.2500 mg | INTRAMUSCULAR | Status: DC | PRN
  Administered 2018-12-08: 12.5 mg via INTRAVENOUS

## 2018-12-08 MED ORDER — BISACODYL 10 MG RE SUPP: 10.0000 mg | Freq: Every day | RECTAL | Status: DC | PRN

## 2018-12-08 MED ORDER — BUPIVACAINE HCL (PF) 0.75 % IJ SOLN
INTRAMUSCULAR | Status: DC | PRN
  Administered 2018-12-08: 1.6 mL via INTRATHECAL

## 2018-12-08 MED ORDER — MIDAZOLAM HCL 2 MG/2ML IJ SOLN
INTRAMUSCULAR | Status: AC
  Filled 2018-12-08: qty 2

## 2018-12-08 MED ORDER — CHLORHEXIDINE GLUCONATE 4 % EX LIQD: 60.0000 mL | Freq: Once | CUTANEOUS | Status: DC

## 2018-12-08 MED ORDER — MIDAZOLAM HCL 2 MG/2ML IJ SOLN
1.0000 mg | INTRAMUSCULAR | Status: DC
  Administered 2018-12-08 (×2): 1 mg via INTRAVENOUS

## 2018-12-08 MED ORDER — DEXAMETHASONE SODIUM PHOSPHATE 10 MG/ML IJ SOLN
10.0000 mg | Freq: Once | INTRAMUSCULAR | Status: AC
  Administered 2018-12-09: 08:00:00 10 mg via INTRAVENOUS
  Filled 2018-12-08: qty 1

## 2018-12-08 MED ORDER — FLEET ENEMA 7-19 GM/118ML RE ENEM: 1.0000 | ENEMA | Freq: Once | RECTAL | Status: DC | PRN

## 2018-12-08 MED ORDER — METHOCARBAMOL 500 MG IVPB - SIMPLE MED
INTRAVENOUS | Status: AC
  Filled 2018-12-08: qty 50

## 2018-12-08 MED ORDER — ACETAMINOPHEN 10 MG/ML IV SOLN
1000.0000 mg | Freq: Four times a day (QID) | INTRAVENOUS | Status: DC
  Administered 2018-12-08: 08:00:00 1000 mg via INTRAVENOUS
  Filled 2018-12-08: qty 100

## 2018-12-08 MED ORDER — TRAMADOL HCL 50 MG PO TABS
50.0000 mg | ORAL_TABLET | Freq: Four times a day (QID) | ORAL | Status: DC | PRN
  Administered 2018-12-08: 50 mg via ORAL
  Filled 2018-12-08: qty 1

## 2018-12-08 MED ORDER — PROPOFOL 500 MG/50ML IV EMUL
INTRAVENOUS | Status: DC | PRN
  Administered 2018-12-08: 75 ug/kg/min via INTRAVENOUS

## 2018-12-08 MED ORDER — PROPOFOL 10 MG/ML IV BOLUS
INTRAVENOUS | Status: AC
  Filled 2018-12-08: qty 60

## 2018-12-08 MED ORDER — DIPHENHYDRAMINE HCL 12.5 MG/5ML PO ELIX: 12.5000 mg | ORAL_SOLUTION | ORAL | Status: DC | PRN

## 2018-12-08 MED ORDER — METOCLOPRAMIDE HCL 5 MG/ML IJ SOLN: 5.0000 mg | Freq: Three times a day (TID) | INTRAMUSCULAR | Status: DC | PRN

## 2018-12-08 MED ORDER — PROMETHAZINE HCL 25 MG/ML IJ SOLN: 6.2500 mg | INTRAMUSCULAR | Status: DC | PRN

## 2018-12-08 MED ORDER — OXYCODONE HCL 5 MG PO TABS
5.0000 mg | ORAL_TABLET | ORAL | Status: DC | PRN
  Administered 2018-12-08: 19:00:00 10 mg via ORAL
  Administered 2018-12-08 (×2): 5 mg via ORAL
  Administered 2018-12-09 (×3): 10 mg via ORAL
  Filled 2018-12-08: qty 1
  Filled 2018-12-08 (×2): qty 2
  Filled 2018-12-08: qty 1
  Filled 2018-12-08 (×2): qty 2

## 2018-12-08 MED ORDER — ASPIRIN EC 325 MG PO TBEC
325.0000 mg | DELAYED_RELEASE_TABLET | Freq: Two times a day (BID) | ORAL | Status: DC
  Administered 2018-12-09: 08:00:00 325 mg via ORAL
  Filled 2018-12-08: qty 1

## 2018-12-08 MED ORDER — ROPIVACAINE HCL 5 MG/ML IJ SOLN
INTRAMUSCULAR | Status: DC | PRN
  Administered 2018-12-08: 30 mL via PERINEURAL

## 2018-12-08 MED ORDER — ONDANSETRON HCL 4 MG PO TABS: 4.0000 mg | ORAL_TABLET | Freq: Four times a day (QID) | ORAL | Status: DC | PRN

## 2018-12-08 MED ORDER — MENTHOL 3 MG MT LOZG: 1.0000 | LOZENGE | OROMUCOSAL | Status: DC | PRN

## 2018-12-08 MED ORDER — CEFAZOLIN SODIUM-DEXTROSE 2-4 GM/100ML-% IV SOLN
2.0000 g | INTRAVENOUS | Status: AC
  Administered 2018-12-08: 08:00:00 2 g via INTRAVENOUS
  Filled 2018-12-08: qty 100

## 2018-12-08 MED ORDER — POVIDONE-IODINE 10 % EX SWAB
2.0000 "application " | Freq: Once | CUTANEOUS | Status: AC
  Administered 2018-12-08: 2 via TOPICAL

## 2018-12-08 MED ORDER — MORPHINE SULFATE (PF) 2 MG/ML IV SOLN
1.0000 mg | INTRAVENOUS | Status: DC | PRN
  Administered 2018-12-08: 23:00:00 2 mg via INTRAVENOUS
  Filled 2018-12-08: qty 1

## 2018-12-08 MED ORDER — STERILE WATER FOR IRRIGATION IR SOLN
Status: DC | PRN
  Administered 2018-12-08: 2000 mL

## 2018-12-08 MED ORDER — SODIUM CHLORIDE (PF) 0.9 % IJ SOLN
INTRAMUSCULAR | Status: DC | PRN
  Administered 2018-12-08: 60 mL

## 2018-12-08 MED ORDER — GABAPENTIN 300 MG PO CAPS
600.0000 mg | ORAL_CAPSULE | Freq: Every day | ORAL | Status: DC
  Administered 2018-12-08: 23:00:00 600 mg via ORAL
  Filled 2018-12-08: qty 2

## 2018-12-08 MED ORDER — TRANEXAMIC ACID-NACL 1000-0.7 MG/100ML-% IV SOLN
1000.0000 mg | INTRAVENOUS | Status: AC
  Administered 2018-12-08: 08:00:00 1000 mg via INTRAVENOUS
  Filled 2018-12-08: qty 100

## 2018-12-08 MED ORDER — LACTATED RINGERS IV SOLN
INTRAVENOUS | Status: DC
  Administered 2018-12-08 (×2): via INTRAVENOUS

## 2018-12-08 MED ORDER — MEPERIDINE HCL 50 MG/ML IJ SOLN
INTRAMUSCULAR | Status: AC
  Filled 2018-12-08: qty 1

## 2018-12-08 MED ORDER — ACETAMINOPHEN 500 MG PO TABS
1000.0000 mg | ORAL_TABLET | Freq: Four times a day (QID) | ORAL | Status: DC
  Administered 2018-12-08 – 2018-12-09 (×3): 1000 mg via ORAL
  Filled 2018-12-08 (×4): qty 2

## 2018-12-08 MED ORDER — ONDANSETRON HCL 4 MG/2ML IJ SOLN: 4.0000 mg | Freq: Four times a day (QID) | INTRAMUSCULAR | Status: DC | PRN

## 2018-12-08 MED ORDER — BUPIVACAINE LIPOSOME 1.3 % IJ SUSP
INTRAMUSCULAR | Status: DC | PRN
  Administered 2018-12-08: 20 mL

## 2018-12-08 MED ORDER — HYDROMORPHONE HCL 1 MG/ML IJ SOLN: 0.2500 mg | INTRAMUSCULAR | Status: DC | PRN

## 2018-12-08 MED ORDER — SODIUM CHLORIDE (PF) 0.9 % IJ SOLN
INTRAMUSCULAR | Status: AC
  Filled 2018-12-08: qty 50

## 2018-12-08 MED ORDER — SODIUM CHLORIDE (PF) 0.9 % IJ SOLN
INTRAMUSCULAR | Status: AC
  Filled 2018-12-08: qty 10

## 2018-12-08 MED ORDER — METHOCARBAMOL 500 MG IVPB - SIMPLE MED
500.0000 mg | Freq: Four times a day (QID) | INTRAVENOUS | Status: DC | PRN
  Administered 2018-12-08: 500 mg via INTRAVENOUS
  Filled 2018-12-08: qty 50

## 2018-12-08 MED ORDER — POLYETHYLENE GLYCOL 3350 17 G PO PACK: 17.0000 g | PACK | Freq: Every day | ORAL | Status: DC | PRN

## 2018-12-08 MED ORDER — FENTANYL CITRATE (PF) 100 MCG/2ML IJ SOLN
INTRAMUSCULAR | Status: AC
  Filled 2018-12-08: qty 2

## 2018-12-08 MED ORDER — ALBUTEROL SULFATE (2.5 MG/3ML) 0.083% IN NEBU: 3.0000 mL | INHALATION_SOLUTION | Freq: Four times a day (QID) | RESPIRATORY_TRACT | Status: DC | PRN

## 2018-12-08 MED ORDER — CEFAZOLIN SODIUM-DEXTROSE 1-4 GM/50ML-% IV SOLN
1.0000 g | Freq: Four times a day (QID) | INTRAVENOUS | Status: AC
  Administered 2018-12-08 (×2): 1 g via INTRAVENOUS
  Filled 2018-12-08 (×2): qty 50

## 2018-12-08 MED ORDER — METOCLOPRAMIDE HCL 5 MG PO TABS: 5.0000 mg | ORAL_TABLET | Freq: Three times a day (TID) | ORAL | Status: DC | PRN

## 2018-12-08 MED ORDER — 0.9 % SODIUM CHLORIDE (POUR BTL) OPTIME
TOPICAL | Status: DC | PRN
  Administered 2018-12-08: 08:00:00 1000 mL

## 2018-12-08 MED ORDER — ONDANSETRON HCL 4 MG/2ML IJ SOLN
INTRAMUSCULAR | Status: DC | PRN
  Administered 2018-12-08: 4 mg via INTRAVENOUS

## 2018-12-08 MED ORDER — METHOCARBAMOL 500 MG PO TABS
500.0000 mg | ORAL_TABLET | Freq: Four times a day (QID) | ORAL | Status: DC | PRN
  Administered 2018-12-08 – 2018-12-09 (×2): 500 mg via ORAL
  Filled 2018-12-08 (×2): qty 1

## 2018-12-08 MED ORDER — ACETAMINOPHEN 10 MG/ML IV SOLN: 1000.0000 mg | Freq: Once | INTRAVENOUS | Status: DC | PRN

## 2018-12-08 MED ORDER — DEXAMETHASONE SODIUM PHOSPHATE 10 MG/ML IJ SOLN
8.0000 mg | Freq: Once | INTRAMUSCULAR | Status: AC
  Administered 2018-12-08: 10 mg via INTRAVENOUS

## 2018-12-08 MED ORDER — DOCUSATE SODIUM 100 MG PO CAPS
100.0000 mg | ORAL_CAPSULE | Freq: Two times a day (BID) | ORAL | Status: DC
  Administered 2018-12-08 – 2018-12-09 (×2): 100 mg via ORAL
  Filled 2018-12-08 (×2): qty 1

## 2018-12-08 MED ORDER — PHENOL 1.4 % MT LIQD
1.0000 | OROMUCOSAL | Status: DC | PRN
  Filled 2018-12-08: qty 177

## 2018-12-08 MED ORDER — PROPOFOL 10 MG/ML IV BOLUS
INTRAVENOUS | Status: DC | PRN
  Administered 2018-12-08 (×2): 30 mg via INTRAVENOUS

## 2018-12-08 MED ORDER — BUPIVACAINE IN DEXTROSE 0.75-8.25 % IT SOLN
INTRATHECAL | Status: DC | PRN
  Administered 2018-12-08: 1.6 mL via INTRATHECAL

## 2018-12-08 MED ORDER — SODIUM CHLORIDE 0.9 % IR SOLN
Status: DC | PRN
  Administered 2018-12-08: 1000 mL

## 2018-12-08 MED ORDER — GABAPENTIN 300 MG PO CAPS
300.0000 mg | ORAL_CAPSULE | Freq: Every day | ORAL | Status: DC
  Administered 2018-12-09: 08:00:00 300 mg via ORAL
  Filled 2018-12-08 (×2): qty 1

## 2018-12-08 MED ORDER — FENTANYL CITRATE (PF) 100 MCG/2ML IJ SOLN
50.0000 ug | INTRAMUSCULAR | Status: DC
  Administered 2018-12-08 (×2): 50 ug via INTRAVENOUS

## 2018-12-08 SURGICAL SUPPLY — 62 items
ATTUNE PS FEM LT SZ 5 CEM KNEE (Femur) ×2 IMPLANT
ATTUNE PSRP INSR SZ 5 10M KNEE (Insert) ×2 IMPLANT
BAG ZIPLOCK 12X15 (MISCELLANEOUS) ×3 IMPLANT
BANDAGE ACE 6X5 VEL STRL LF (GAUZE/BANDAGES/DRESSINGS) ×5 IMPLANT
BASE TIBIAL ROT PLAT SZ 5 KNEE (Knees) IMPLANT
BLADE SAG 18X100X1.27 (BLADE) ×3 IMPLANT
BLADE SAW SGTL 11.0X1.19X90.0M (BLADE) ×3 IMPLANT
BLADE SURG SZ10 CARB STEEL (BLADE) ×6 IMPLANT
BOWL SMART MIX CTS (DISPOSABLE) ×3 IMPLANT
CEMENT HV SMART SET (Cement) ×6 IMPLANT
CLOSURE STERI-STRIP 1/2X4 (GAUZE/BANDAGES/DRESSINGS) ×1
CLOSURE WOUND 1/2 X4 (GAUZE/BANDAGES/DRESSINGS) ×2
CLSR STERI-STRIP ANTIMIC 1/2X4 (GAUZE/BANDAGES/DRESSINGS) ×1 IMPLANT
COVER SURGICAL LIGHT HANDLE (MISCELLANEOUS) ×3 IMPLANT
COVER WAND RF STERILE (DRAPES) IMPLANT
CUFF TOURN SGL QUICK 34 (TOURNIQUET CUFF) ×2
CUFF TRNQT CYL 34X4.125X (TOURNIQUET CUFF) ×1 IMPLANT
DECANTER SPIKE VIAL GLASS SM (MISCELLANEOUS) ×3 IMPLANT
DRAPE U-SHAPE 47X51 STRL (DRAPES) ×3 IMPLANT
DRESSING VAC ATS ABD (GAUZE/BANDAGES/DRESSINGS) ×2 IMPLANT
DRSG ADAPTIC 3X8 NADH LF (GAUZE/BANDAGES/DRESSINGS) ×3 IMPLANT
DRSG PAD ABDOMINAL 8X10 ST (GAUZE/BANDAGES/DRESSINGS) ×3 IMPLANT
DURAPREP 26ML APPLICATOR (WOUND CARE) ×3 IMPLANT
ELECT REM PT RETURN 15FT ADLT (MISCELLANEOUS) ×3 IMPLANT
EVACUATOR 1/8 PVC DRAIN (DRAIN) ×3 IMPLANT
GAUZE SPONGE 4X4 12PLY STRL (GAUZE/BANDAGES/DRESSINGS) ×3 IMPLANT
GLOVE BIO SURGEON STRL SZ7 (GLOVE) ×3 IMPLANT
GLOVE BIO SURGEON STRL SZ8 (GLOVE) ×3 IMPLANT
GLOVE BIOGEL PI IND STRL 6.5 (GLOVE) ×1 IMPLANT
GLOVE BIOGEL PI IND STRL 7.0 (GLOVE) ×1 IMPLANT
GLOVE BIOGEL PI IND STRL 8 (GLOVE) ×1 IMPLANT
GLOVE BIOGEL PI INDICATOR 6.5 (GLOVE) ×2
GLOVE BIOGEL PI INDICATOR 7.0 (GLOVE) ×2
GLOVE BIOGEL PI INDICATOR 8 (GLOVE) ×2
GLOVE SURG SS PI 6.5 STRL IVOR (GLOVE) ×3 IMPLANT
GOWN STRL REUS W/TWL LRG LVL3 (GOWN DISPOSABLE) ×9 IMPLANT
HANDPIECE INTERPULSE COAX TIP (DISPOSABLE) ×2
HOLDER FOLEY CATH W/STRAP (MISCELLANEOUS) IMPLANT
IMMOBILIZER KNEE 20 (SOFTGOODS) ×3
IMMOBILIZER KNEE 20 THIGH 36 (SOFTGOODS) ×1 IMPLANT
KIT TURNOVER KIT A (KITS) IMPLANT
MANIFOLD NEPTUNE II (INSTRUMENTS) ×3 IMPLANT
MARKER SKIN DUAL TIP RULER LAB (MISCELLANEOUS) ×2 IMPLANT
NS IRRIG 1000ML POUR BTL (IV SOLUTION) ×3 IMPLANT
PACK TOTAL KNEE CUSTOM (KITS) ×3 IMPLANT
PADDING CAST COTTON 6X4 STRL (CAST SUPPLIES) ×5 IMPLANT
PATELLA MEDIAL ATTUN 35MM KNEE (Knees) ×2 IMPLANT
PIN STEINMAN FIXATION KNEE (PIN) ×2 IMPLANT
PIN THREADED HEADED SIGMA (PIN) ×2 IMPLANT
PROTECTOR NERVE ULNAR (MISCELLANEOUS) ×3 IMPLANT
SET HNDPC FAN SPRY TIP SCT (DISPOSABLE) ×1 IMPLANT
STRIP CLOSURE SKIN 1/2X4 (GAUZE/BANDAGES/DRESSINGS) ×4 IMPLANT
SUT MNCRL AB 4-0 PS2 18 (SUTURE) ×3 IMPLANT
SUT STRATAFIX 0 PDS 27 VIOLET (SUTURE) ×3
SUT VIC AB 2-0 CT1 27 (SUTURE) ×8
SUT VIC AB 2-0 CT1 TAPERPNT 27 (SUTURE) ×3 IMPLANT
SUTURE STRATFX 0 PDS 27 VIOLET (SUTURE) ×1 IMPLANT
TIBIAL BASE ROT PLAT SZ 5 KNEE (Knees) ×3 IMPLANT
TRAY FOLEY MTR SLVR 16FR STAT (SET/KITS/TRAYS/PACK) ×3 IMPLANT
WATER STERILE IRR 1000ML POUR (IV SOLUTION) ×6 IMPLANT
WRAP KNEE MAXI GEL POST OP (GAUZE/BANDAGES/DRESSINGS) ×3 IMPLANT
YANKAUER SUCT BULB TIP 10FT TU (MISCELLANEOUS) ×3 IMPLANT

## 2018-12-08 NOTE — Discharge Instructions (Signed)
° °Dr. Frank Aluisio °Total Joint Specialist °[facility] °[address] °[place], [place] [address] °[phone] ° °TOTAL KNEE REPLACEMENT POSTOPERATIVE DIRECTIONS ° °Knee Rehabilitation, Guidelines Following Surgery  °Results after knee surgery are often greatly improved when you follow the exercise, range of motion and muscle strengthening exercises prescribed by your doctor. Safety measures are also important to protect the knee from further injury. Any time any of these exercises cause you to have increased pain or swelling in your knee joint, decrease the amount until you are comfortable again and slowly increase them. If you have problems or questions, call your caregiver or physical therapist for advice.  ° °HOME CARE INSTRUCTIONS  °• Remove items at home which could result in a fall. This includes throw rugs or furniture in walking pathways.  °· ICE to the affected knee every three hours for 30 minutes at a time and then as needed for pain and swelling.  Continue to use ice on the knee for pain and swelling from surgery. You may notice swelling that will progress down to the foot and ankle.  This is normal after surgery.  Elevate the leg when you are not up walking on it.   °· Continue to use the breathing machine which will help keep your temperature down.  It is common for your temperature to cycle up and down following surgery, especially at night when you are not up moving around and exerting yourself.  The breathing machine keeps your lungs expanded and your temperature down. °· Do not place pillow under knee, focus on keeping the knee straight while resting ° °DIET °You may resume your previous home diet once your are discharged from the hospital. ° °DRESSING / WOUND CARE / SHOWERING °You may shower 3 days after surgery, but keep the wounds dry during showering.  You may use an occlusive plastic wrap (Press'n Seal for example), NO SOAKING/SUBMERGING IN THE BATHTUB.  If the bandage  gets wet, change with a clean dry gauze.  If the incision gets wet, pat the wound dry with a clean towel. °You may start showering once you are discharged home but do not submerge the incision under water. Just pat the incision dry and apply a dry gauze dressing on daily. °Change the surgical dressing daily and reapply a dry dressing each time. ° °ACTIVITY °Walk with your walker as instructed. °Use walker as long as suggested by your caregivers. °Avoid periods of inactivity such as sitting longer than an hour when not asleep. This helps prevent blood clots.  °You may resume a sexual relationship in one month or when given the OK by your doctor.  °You may return to work once you are cleared by your doctor.  °Do not drive a car for 6 weeks or until released by you surgeon.  °Do not drive while taking narcotics. ° °WEIGHT BEARING °Weight bearing as tolerated with assist device (walker, cane, etc) as directed, use it as long as suggested by your surgeon or therapist, typically at least 4-6 weeks. ° °POSTOPERATIVE CONSTIPATION PROTOCOL °Constipation - defined medically as fewer than three stools per week and severe constipation as less than one stool per week. ° °One of the most common issues patients have following surgery is constipation.  Even if you have a regular bowel pattern at home, your normal regimen is likely to be disrupted due to multiple reasons following surgery.  Combination of anesthesia, postoperative narcotics, change in appetite and fluid intake all can affect your bowels.    In order to avoid complications following surgery, here are some recommendations in order to help you during your recovery period. ° °Colace (docusate) - Pick up an over-the-counter form of Colace or another stool softener and take twice a day as long as you are requiring postoperative pain medications.  Take with a full glass of water daily.  If you experience loose stools or diarrhea, hold the colace until you stool forms back  up.  If your symptoms do not get better within 1 week or if they get worse, check with your doctor. ° °Dulcolax (bisacodyl) - Pick up over-the-counter and take as directed by the product packaging as needed to assist with the movement of your bowels.  Take with a full glass of water.  Use this product as needed if not relieved by Colace only.  ° °MiraLax (polyethylene glycol) - Pick up over-the-counter to have on hand.  MiraLax is a solution that will increase the amount of water in your bowels to assist with bowel movements.  Take as directed and can mix with a glass of water, juice, soda, coffee, or tea.  Take if you go more than two days without a movement. °Do not use MiraLax more than once per day. Call your doctor if you are still constipated or irregular after using this medication for 7 days in a row. ° °If you continue to have problems with postoperative constipation, please contact the office for further assistance and recommendations.  If you experience "the worst abdominal pain ever" or develop nausea or vomiting, please contact the office immediatly for further recommendations for treatment. ° °ITCHING ° If you experience itching with your medications, try taking only a single pain pill, or even half a pain pill at a time.  You can also use Benadryl over the counter for itching or also to help with sleep.  ° °TED HOSE STOCKINGS °Wear the elastic stockings on both legs for three weeks following surgery during the day but you may remove then at night for sleeping. ° °MEDICATIONS °See your medication summary on the “After Visit Summary” that the nursing staff will review with you prior to discharge.  You may have some home medications which will be placed on hold until you complete the course of blood thinner medication.  It is important for you to complete the blood thinner medication as prescribed by your surgeon.  Continue your approved medications as instructed at time of discharge. ° °PRECAUTIONS °If  you experience chest pain or shortness of breath - call 911 immediately for transfer to the hospital emergency department.  °If you develop a fever greater that 101 F, purulent drainage from wound, increased redness or drainage from wound, foul odor from the wound/dressing, or calf pain - CONTACT YOUR SURGEON.   °                                                °FOLLOW-UP APPOINTMENTS °Make sure you keep all of your appointments after your operation with your surgeon and caregivers. You should call the office at the above phone number and make an appointment for approximately two weeks after the date of your surgery or on the date instructed by your surgeon outlined in the "After Visit Summary". ° ° °RANGE OF MOTION AND STRENGTHENING EXERCISES  °Rehabilitation of the knee is important following a knee injury or   an operation. After just a few days of immobilization, the muscles of the thigh which control the knee become weakened and shrink (atrophy). Knee exercises are designed to build up the tone and strength of the thigh muscles and to improve knee motion. Often times heat used for twenty to thirty minutes before working out will loosen up your tissues and help with improving the range of motion but do not use heat for the first two weeks following surgery. These exercises can be done on a training (exercise) mat, on the floor, on a table or on a bed. Use what ever works the best and is most comfortable for you Knee exercises include:  °• Leg Lifts - While your knee is still immobilized in a splint or cast, you can do straight leg raises. Lift the leg to 60 degrees, hold for 3 sec, and slowly lower the leg. Repeat 10-20 times 2-3 times daily. Perform this exercise against resistance later as your knee gets better.  °• Quad and Hamstring Sets - Tighten up the muscle on the front of the thigh (Quad) and hold for 5-10 sec. Repeat this 10-20 times hourly. Hamstring sets are done by pushing the foot backward against an  object and holding for 5-10 sec. Repeat as with quad sets.  °· Leg Slides: Lying on your back, slowly slide your foot toward your buttocks, bending your knee up off the floor (only go as far as is comfortable). Then slowly slide your foot back down until your leg is flat on the floor again. °· Angel Wings: Lying on your back spread your legs to the side as far apart as you can without causing discomfort.  °A rehabilitation program following serious knee injuries can speed recovery and prevent re-injury in the future due to weakened muscles. Contact your doctor or a physical therapist for more information on knee rehabilitation.  ° °IF YOU ARE TRANSFERRED TO A SKILLED REHAB FACILITY °If the patient is transferred to a skilled rehab facility following release from the hospital, a list of the current medications will be sent to the facility for the patient to continue.  When discharged from the skilled rehab facility, please have the facility set up the patient's Home Health Physical Therapy prior to being released. Also, the skilled facility will be responsible for providing the patient with their medications at time of release from the facility to include their pain medication, the muscle relaxants, and their blood thinner medication. If the patient is still at the rehab facility at time of the two week follow up appointment, the skilled rehab facility will also need to assist the patient in arranging follow up appointment in our office and any transportation needs. ° °MAKE SURE YOU:  °• Understand these instructions.  °• Get help right away if you are not doing well or get worse.  ° ° °Pick up stool softner and laxative for home use following surgery while on pain medications. °Do not submerge incision under water. °Please use good hand washing techniques while changing dressing each day. °May shower starting three days after surgery. °Please use a clean towel to pat the incision dry following showers. °Continue to  use ice for pain and swelling after surgery. °Do not use any lotions or creams on the incision until instructed by your surgeon. ° °

## 2018-12-08 NOTE — Plan of Care (Signed)
  Problem: Education: Goal: Knowledge of General Education information will improve Description: Including pain rating scale, medication(s)/side effects and non-pharmacologic comfort measures Outcome: Progressing   Problem: Health Behavior/Discharge Planning: Goal: Ability to manage health-related needs will improve Outcome: Progressing   Problem: Clinical Measurements: Goal: Ability to maintain clinical measurements within normal limits will improve Outcome: Progressing   Problem: Clinical Measurements: Goal: Diagnostic test results will improve Outcome: Progressing   Problem: Clinical Measurements: Goal: Respiratory complications will improve Outcome: Progressing   Problem: Clinical Measurements: Goal: Cardiovascular complication will be avoided Outcome: Progressing   

## 2018-12-08 NOTE — Transfer of Care (Signed)
Immediate Anesthesia Transfer of Care Note  Patient: Traci Drake  Procedure(s) Performed: TOTAL KNEE ARTHROPLASTY (Left )  Patient Location: PACU  Anesthesia Type:MAC combined with regional for post-op pain  Level of Consciousness: awake, alert , oriented and patient cooperative  Airway & Oxygen Therapy: Patient Spontanous Breathing and Patient connected to nasal cannula oxygen  Post-op Assessment: Report given to RN and Post -op Vital signs reviewed and stable  Post vital signs: Reviewed and stable  Last Vitals:  Vitals Value Taken Time  BP 131/83 12/08/18 0931  Temp    Pulse 76 12/08/18 0932  Resp 14 12/08/18 0932  SpO2 100 % 12/08/18 0932  Vitals shown include unvalidated device data.  Last Pain:  Vitals:   12/08/18 0633  TempSrc:   PainSc: 0-No pain      Patients Stated Pain Goal: 4 (62/83/15 1761)  Complications: No apparent anesthesia complications

## 2018-12-08 NOTE — Evaluation (Signed)
Physical Therapy Evaluation Patient Details Name: Traci Drake MRN: [id number] DOB: [birth year] TodStevie Drake's Date: 12/08/2018   History of Present Illness  s/p L TKA. [facility]  Clinical Impression  Pt is s/p TKA resulting in the deficits listed below (see PT Problem List).  Pt amb ~ 7580' with RW and min/guard assist. Anticipate pt will continue to progress well, likely d/c tomorrow. Pt will benefit from skilled PT to increase their independence and safety with mobility to allow discharge to the venue listed below.      Follow Up Recommendations Follow surgeon's recommendation for DC plan and follow-up therapies(OPPT appt Thurs)    Equipment Recommendations  None recommended by PT    Recommendations for Other Services       Precautions / Restrictions Precautions Precautions: Knee Required Braces or Orthoses: Knee Immobilizer - Left Knee Immobilizer - Left: Discontinue once straight leg raise with < 10 degree lag Restrictions Weight Bearing Restrictions: No Other Position/Activity Restrictions: WBAT      Mobility  Bed Mobility Overal bed mobility: Needs Assistance Bed Mobility: Supine to Sit     Supine to sit: Min assist     General bed mobility comments: assist with LLE  Transfers Overall transfer level: Needs assistance Equipment used: Rolling walker (2 wheeled) Transfers: Sit to/from Stand Sit to Stand: Min guard         General transfer comment: cues for hand placement  Ambulation/Gait Ambulation/Gait assistance: Min guard Gait Distance (Feet): 80 Feet Assistive device: Rolling walker (2 wheeled) Gait Pattern/deviations: Step-to pattern     General Gait Details: cues for sequence, RW position  Stairs            Wheelchair Mobility    Modified Rankin (Stroke Patients Only)       Balance                                             Pertinent Vitals/Pain Pain Assessment: 0-10 Pain Score: 3  Pain  Location: left knee Pain Descriptors / Indicators: Grimacing;Guarding Pain Intervention(s): Limited activity within patient's tolerance;Monitored during session;Premedicated before session    Home Living Family/patient expects to be discharged to:: Private residence Living Arrangements: Spouse/significant other Available Help at Discharge: Family Type of Home: House Home Access: Stairs to enter   [employer] CorporationEntrance Stairs-Number of Steps: 2 in back Home Layout: Two level;Able to live on main level with bedroom/bathroom Home Equipment: Walker - 2 wheels      Prior Function Level of Independence: Independent               Hand Dominance        Extremity/Trunk Assessment   Upper Extremity Assessment Upper Extremity Assessment: Overall WFL for tasks assessed    Lower Extremity Assessment Lower Extremity Assessment: LLE deficits/detail LLE Deficits / Details: ankle WFL, knee extension and hip flexion 2+/5 with ~ 8* quad lag. AAROM~ 6* to 60* knee flexion       Communication   Communication: No difficulties  Cognition Arousal/Alertness: Awake/alert Behavior During Therapy: WFL for tasks assessed/performed Overall Cognitive Status: Within Functional Limits for tasks assessed                                        General Comments  Exercises Total Joint Exercises Ankle Circles/Pumps: AROM;10 reps;Both Quad Sets: AROM;5 reps;Both   Assessment/Plan    PT Assessment Patient needs continued PT services  PT Problem List Decreased strength;Decreased range of motion;Decreased activity tolerance;Decreased mobility;Decreased knowledge of use of DME;Pain       PT Treatment Interventions Gait training;DME instruction;Therapeutic exercise;Functional mobility training;Therapeutic activities;Patient/family education;Stair training    PT Goals (Current goals can be found in the Care Plan section)  Acute Rehab PT Goals Patient Stated Goal: home PT Goal  Formulation: With patient Time For Goal Achievement: 12/15/18 Potential to Achieve Goals: Good    Frequency 7X/week   Barriers to discharge        Co-evaluation               AM-PAC PT "6 Clicks" Mobility  Outcome Measure Help needed turning from your back to your side while in a flat bed without using bedrails?: A Little Help needed moving from lying on your back to sitting on the side of a flat bed without using bedrails?: A Little Help needed moving to and from a bed to a chair (including a wheelchair)?: A Little Help needed standing up from a chair using your arms (e.g., wheelchair or bedside chair)?: A Little Help needed to walk in hospital room?: A Little Help needed climbing 3-5 steps with a railing? : A Little 6 Click Score: 18    End of Session Equipment Utilized During Treatment: Gait belt;Left knee immobilizer Activity Tolerance: Patient tolerated treatment well Patient left: in chair;with call bell/phone within reach;with chair alarm set   PT Visit Diagnosis: Difficulty in walking, not elsewhere classified (R26.2)    Time: 8502-7741 PT Time Calculation (min) (ACUTE ONLY): 30 min   Charges:   PT Evaluation $PT Eval Low Complexity: 1 Low PT Treatments $Gait Training: 8-22 mins        Kenyon Ana, PT  Pager: [phone] Acute Rehab Dept [facility]): [phone]   12/08/2018   [facility] 12/08/2018, 3:38 PM

## 2018-12-08 NOTE — Anesthesia Procedure Notes (Signed)
Procedure Name: MAC Date/Time: 12/08/2018 8:00 AM Performed by: Claudia Desanctis, CRNA Pre-anesthesia Checklist: Patient identified, Emergency Drugs available, Suction available, Patient being monitored and Timeout performed Patient Re-evaluated:Patient Re-evaluated prior to induction Oxygen Delivery Method: Simple face mask

## 2018-12-08 NOTE — Op Note (Signed)
OPERATIVE REPORT-TOTAL KNEE ARTHROPLASTY   Pre-operative diagnosis- Osteoarthritis  Left knee(s)  Post-operative diagnosis- Osteoarthritis Left knee(s)  Procedure-  Left  Total Knee Arthroplasty  Surgeon- Daven Pinckney V. Isidor Bromell, MD  Assistant- Amber Constable, PA-C  Anesthesia-  Adductor canal block and spinal   EBL-25 mL   Drains Hemovac  Tourniquet time-  Total Tourniquet Time Documented: Thigh (Left) - 36 minutes Total: Thigh (Left) - 36 minutes     Complications- None  Condition-PACU - hemodynamically stable.   Brief Clinical Note  Traci Drake is a 63 y.o. year old female with end stage OA of her left knee with progressively worsening pain and dysfunction. She has constant pain, with activity and at rest and significant functional deficits with difficulties even with ADLs. She has had extensive non-op management including analgesics, injections of cortisone and viscosupplements, and home exercise program, but remains in significant pain with significant dysfunction. Radiographs show bone on bone arthritis bone on bone medial with tibial subluxation. She presents now for left Total Knee Arthroplasty.    Procedure in detail---   The patient is brought into the operating room and positioned supine on the operating table. After successful administration of  Adductor canal block and spinal ,   a tourniquet is placed high on the  Left thigh(s) and the lower extremity is prepped and draped in the usual sterile fashion. Time out is perfor21.3ed by the operating team and then the  Left lower extremity i21.61m3 Hattie [phone] appropriate size for the tibia and the proximal tibia is prepared with the modular drill and keel punch for that size.      The femoral sizing guide is placed and size 5 is most appropriate. Rotation is marked off the epicondylar axis and confirmed by creating a rectangular flexion gap at 90 degrees. The size 5 cutting block is pinned in this rotation and the anterior, posterior and chamfer cuts are made with the oscillating saw. The intercondylar block is then placed and that cut  is made.      Trial size 5 tibial component, trial size 5 posterior stabilized femur and a 10  mm posterior stabilized rotating platform insert trial is placed. Full extension is achieved with excellent varus/valgus and anterior/posterior balance throughout full range of motion. The patella is everted and thickness measured to be 24  mm. Free hand resection is taken to 14 mm, a 35  template is placed, lug holes are drilled, trial patella is placed, and it tracks normally. Osteophytes are removed off the posterior femur with the trial in place. All trials are removed and the cut bone surfaces prepared with pulsatile lavage. Cement is mixed and once ready for implantation, the size 5 tibial implant, size  5 posterior stabilized femoral component, and the size 35 patella are cemented in place and the patella is held with the clamp. The trial insert is placed and the knee held in full extension. The Exparel (20 ml mixed with 60 ml saline) is injected into the extensor mechanism, posterior capsule, medial and lateral gutters and subcutaneous tissues.  All extruded cement is removed and once the cement is hard the permanent 10 mm posterior stabilized rotating platform insert is placed into the tibial tray.      The wound is copiously irrigated with saline solution and the extensor mechanism closed over a hemovac drain with #1 V-loc suture. The tourniquet is released for a total tourniquet time of 36 minutes. Flexion against gravity is 140 degrees and the patella tracks normally. Subcutaneous tissue is closed with 2.0 vicryl and subcuticular with running 4.0 Monocryl. The incision is cleaned and dried and steri-strips and a bulky sterile dressing are applied. The limb is placed into a knee immobilizer and the patient is awakened and transported to recovery in stable condition.      Please note that a surgical assistant was a medical necessity for this procedure in order to perform it in a safe and expeditious manner. Surgical assistant was necessary to retract the ligaments and vital neurovascular structures to prevent injury to them and also necessary for proper positioning of the limb to allow for anatomic placement of the prosthesis.   Dione Plover Tinya Cadogan, MD    12/08/2018, 9:13 AM

## 2018-12-08 NOTE — Anesthesia Procedure Notes (Signed)
Spinal  Patient location during procedure: OR Start time: 12/08/2018 8:05 AM End time: 12/08/2018 8:10 AM Staffing Anesthesiologist: Murvin Natal, MD Performed: anesthesiologist  Preanesthetic Checklist Completed: patient identified, surgical consent, pre-op evaluation, timeout performed, IV checked, risks and benefits discussed and monitors and equipment checked Spinal Block Patient position: sitting Prep: DuraPrep Patient monitoring: cardiac monitor, continuous pulse ox and blood pressure Approach: midline Location: L4-5 Injection technique: single-shot Needle Needle type: Pencan  Needle gauge: 24 G Needle length: 9 cm Assessment Sensory level: T10 Additional Notes Functioning IV was confirmed and monitors were applied. Sterile prep and drape, including hand hygiene and sterile gloves were used. The patient was positioned and the spine was prepped. The skin was anesthetized with lidocaine.  Free flow of clear CSF was obtained prior to injecting local anesthetic into the CSF.  The spinal needle aspirated freely following injection.  The needle was carefully withdrawn.  The patient tolerated the procedure well.

## 2018-12-08 NOTE — Anesthesia Postprocedure Evaluation (Signed)
Anesthesia Post Note  Patient: TENIYA FILTER  Procedure(s) Performed: TOTAL KNEE ARTHROPLASTY (Left )     Patient location during evaluation: PACU Anesthesia Type: Regional and Spinal Level of consciousness: oriented and awake and alert Pain management: pain level controlled Vital Signs Assessment: post-procedure vital signs reviewed and stable Respiratory status: spontaneous breathing, respiratory function stable and patient connected to nasal cannula oxygen Cardiovascular status: blood pressure returned to baseline and stable Postop Assessment: no headache, no backache, no apparent nausea or vomiting and spinal receding Anesthetic complications: no    Last Vitals:  Vitals:   12/08/18 1327 12/08/18 1329  BP:  131/78  Pulse: 86 86  Resp:  16  Temp:  36.9 C  SpO2: 96% 98%    Last Pain:  Vitals:   12/08/18 1410  TempSrc:   PainSc: 0-No pain                 Ryan P Ellender

## 2018-12-08 NOTE — Anesthesia Procedure Notes (Signed)
Anesthesia Regional Block: Adductor canal block   Pre-Anesthetic Checklist: ,, timeout performed, Correct Patient, Correct Site, Correct Laterality, Correct Procedure,, site marked, risks and benefits discussed, Surgical consent,  Pre-op evaluation,  At surgeon's request and post-op pain management  Laterality: Left  Prep: chloraprep       Needles:  Injection technique: Single-shot  Needle Type: Echogenic Stimulator Needle     Needle Length: 9cm  Needle Gauge: 21     Additional Needles:   Procedures:,,,, ultrasound used (permanent image in chart),,,,  Narrative:  Start time: 12/08/2018 7:05 AM End time: 12/08/2018 7:15 AM Injection made incrementally with aspirations every 5 mL.  Performed by: Personally  Anesthesiologist: Murvin Natal, MD  Additional Notes: Functioning IV was confirmed and monitors were applied. A time-out was performed. Hand hygiene and sterile gloves were used. The thigh was placed in a frog-leg position and prepped in a sterile fashion. A 53mm 21ga Arrow echogenic stimulator needle was placed using ultrasound guidance.  Negative aspiration and negative test dose prior to incremental administration of local anesthetic. The patient tolerated the procedure well.

## 2018-12-08 NOTE — Interval H&P Note (Signed)
History and Physical Interval Note:  12/08/2018 6:26 AM  Traci Drake  has presented today for surgery, with the diagnosis of left knee osteoarthritis.  The various methods of treatment have been discussed with the patient and family. After consideration of risks, benefits and other options for treatment, the patient has consented to  Procedure(s): TOTAL KNEE ARTHROPLASTY (Left) as a surgical intervention.  The patient's history has been reviewed, patient examined, no change in status, stable for surgery.  I have reviewed the patient's chart and labs.  Questions were answered to the patient's satisfaction.     Pilar Plate Kelten Enochs

## 2018-12-09 ENCOUNTER — Encounter (HOSPITAL_COMMUNITY): Payer: Self-pay | Admitting: Orthopedic Surgery

## 2018-12-09 LAB — CBC
HCT: 37.9 % (ref 36.0–46.0)
Hemoglobin: 12.4 g/dL (ref 12.0–15.0)
MCH: 31.7 pg (ref 26.0–34.0)
MCHC: 32.7 g/dL (ref 30.0–36.0)
MCV: 96.9 fL (ref 80.0–100.0)
Platelets: 215 10*3/uL (ref 150–400)
RBC: 3.91 MIL/uL (ref 3.87–5.11)
RDW: 12.2 % (ref 11.5–15.5)
WBC: 10.1 10*3/uL (ref 4.0–10.5)
nRBC: 0 % (ref 0.0–0.2)

## 2018-12-09 LAB — BASIC METABOLIC PANEL
Anion gap: 7 (ref 5–15)
BUN: 15 mg/dL (ref 8–23)
CO2: 23 mmol/L (ref 22–32)
Calcium: 8.5 mg/dL — ABNORMAL LOW (ref 8.9–10.3)
Chloride: 109 mmol/L (ref 98–111)
Creatinine, Ser: 0.63 mg/dL (ref 0.44–1.00)
GFR calc Af Amer: >60 mL/min (ref >60)
GFR calc non Af Amer: >60 mL/min (ref >60)
Glucose, Bld: 125 mg/dL — ABNORMAL HIGH (ref 70–99)
Potassium: 4.2 mmol/L (ref 3.5–5.1)
Sodium: 139 mmol/L (ref 135–145)

## 2018-12-09 MED ORDER — OXYCODONE HCL 5 MG PO TABS: 5.0000 mg | ORAL_TABLET | Freq: Four times a day (QID) | ORAL | 0 refills | Status: DC | PRN

## 2018-12-09 MED ORDER — ASPIRIN 325 MG PO TBEC: 325.0000 mg | DELAYED_RELEASE_TABLET | Freq: Two times a day (BID) | ORAL | 0 refills | Status: AC

## 2018-12-09 MED ORDER — TRAMADOL HCL 50 MG PO TABS: 50.0000 mg | ORAL_TABLET | Freq: Four times a day (QID) | ORAL | 0 refills | Status: DC | PRN

## 2018-12-09 MED ORDER — METHOCARBAMOL 500 MG PO TABS: 500.0000 mg | ORAL_TABLET | Freq: Four times a day (QID) | ORAL | 0 refills | Status: DC | PRN

## 2018-12-09 NOTE — Progress Notes (Signed)
   Subjective: 1 Day Post-Op Procedure(s) (LRB): TOTAL KNEE ARTHROPLASTY (Left) Patient reports pain as mild.   Patient seen in rounds by Dr. Wynelle Link. Patient is well, and has had no acute complaints or problems. No issues overnight. Denies chest pain, SOB, or calf pain. Foley catheter removed this AM. Did well ambulating with physical therapy yesterday, will continue working with them today.   Objective: Vital signs in last 24 hours: Temp:  [94.2 F (34.6 C)-99.1 F (37.3 C)] 98.3 F (36.8 C) (06/23 0528) Pulse Rate:  [65-86] 80 (06/23 0528) Resp:  [12-21] 17 (06/23 0528) BP: (112-157)/(65-95) 112/65 (06/23 0528) SpO2:  [96 %-100 %] 97 % (06/23 0528)  Intake/Output from previous day:  Intake/Output Summary (Last 24 hours) at 12/09/2018 0746 Last data filed at 12/09/2018 0700 Gross per 24 hour  Intake 3852.58 ml  Output 2762.5 ml  Net 1090.08 ml    Labs: Recent Labs    12/09/18 0328  HGB 12.4   Recent Labs    12/09/18 0328  WBC 10.1  RBC 3.91  HCT 37.9  PLT 215   Recent Labs    12/09/18 0328  NA 139  K 4.2  CL 109  CO2 23  BUN 15  CREATININE 0.63  GLUCOSE 125*  CALCIUM 8.5*   Exam: General - Patient is Alert and Oriented Extremity - Neurologically intact Neurovascular intact Sensation intact distally Dorsiflexion/Plantar flexion intact Dressing - dressing C/D/I Motor Function - intact, moving foot and toes well on exam.   Past Medical History:  Diagnosis Date  . Asthma    seasonal  . Complication of anesthesia    needed albumin in PACU. and had vertigo  . Decreased libido without sexual dysfunction   . Menopause     Assessment/Plan: 1 Day Post-Op Procedure(s) (LRB): TOTAL KNEE ARTHROPLASTY (Left) Principal Problem:   OA (osteoarthritis) of knee  Estimated body mass index is 28.29 kg/m as calculated from the following:   Height as of this encounter: 5\' 5"  (1.651 m).   Weight as of this encounter: 77.1 kg. Advance diet Up with therapy  D/C IV fluids  Anticipated LOS equal to or greater than 2 midnights due to - Age 10 and older with one or more of the following:  - Obesity  - Expected need for hospital services (PT, OT, Nursing) required for safe  discharge  - Anticipated need for postoperative skilled nursing care or inpatient rehab  - Active co-morbidities: None OR   - Unanticipated findings during/Post Surgery: None  - Patient is a high risk of re-admission due to: None    DVT Prophylaxis - Aspirin Weight bearing as tolerated. D/C O2 and pulse ox and try on room air. Hemovac pulled without difficulty, will continue therapy today.  Plan is to go Home after hospital stay. Plan for discharge later today once meeting goals with therapy.  Scheduled for outpatient PT at [facility] in [place]. Follow-up in the office in 2 weeks.   Theresa Duty, PA-C Orthopedic Surgery 12/09/2018, 7:46 AM

## 2018-12-09 NOTE — Progress Notes (Signed)
Physical Therapy Treatment Patient Details Name: Traci Drake MRN: [id number] DOB: 03-Sep-1955 Today's Date: 12/09/2018    History of Present Illness s/p L TKA. H[facility]: [facility]    PT Comments    Patient progressing well able to demonstrate steps appropriate for home entry, ambulated in hallway with walker and completed HEP instruction as well as dicussed car transfers.  No further skilled acute PT needs.  Agree with follow up PT as per MD.   Follow Up Recommendations  Follow surgeon's recommendation for DC plan and follow-up therapies     Equipment Recommendations  None recommended by PT    Recommendations for Other Services       Precautions / Restrictions Precautions Precautions: Knee Required Braces or Orthoses: Knee Immobilizer - Left Knee Immobilizer - Left: Discontinue once straight leg raise with < 10 degree lag Restrictions Weight Bearing Restrictions: No Other Position/Activity Restrictions: WBAT    Mobility  Bed Mobility               General bed mobility comments: up in chair  Transfers   Equipment used: Rolling walker (2 wheeled) Transfers: Sit to/from Stand Sit to Stand: Supervision         General transfer comment: with good technique, reminder about L leg out with stand to sit; discussed car transfer technique  Ambulation/Gait Ambulation/Gait assistance: Supervision Gait Distance (Feet): 150 Feet Assistive device: Rolling walker (2 wheeled) Gait Pattern/deviations: Step-to pattern;Antalgic;Decreased stance time - left;Decreased weight shift to left     General Gait Details: Educated on step through pattern and for knee flexion during terminal stance when able   Stairs Stairs: Yes Stairs assistance: Min assist Stair Management: Backwards;Step to pattern;With walker Number of Stairs: 2 General stair comments: initially attempted with 1 rail sideways, but unale to tolerate increased weight on L so performed reverse  technique with RW and improved tolerance   Wheelchair Mobility    Modified Rankin (Stroke Patients Only)       Balance Overall balance assessment: Mild deficits observed, not formally tested                                          Cognition Arousal/Alertness: Awake/alert Behavior During Therapy: WFL for tasks assessed/performed Overall Cognitive Status: Within Functional Limits for tasks assessed                                        Exercises Total Joint Exercises Ankle Circles/Pumps: AROM;10 reps;Both;Seated Quad Sets: AROM;Both;10 reps;Seated Towel Squeeze: AROM;5 reps;Seated;Left Short Arc Quad: AROM;Left;10 reps;Seated Heel Slides: AROM;AAROM;Left;10 reps;Seated Hip ABduction/ADduction: AROM;Left;10 reps;Seated Straight Leg Raises: AROM;Left;10 reps;AAROM;Seated Goniometric ROM: approx 10-70 degrees    General Comments General comments (skin integrity, edema, etc.): Issued HEP and educated on pain meds prior and ice after.      Pertinent Vitals/Pain Pain Assessment: Faces Faces Pain Scale: Hurts whole lot Pain Location: left knee with exercise Pain Descriptors / Indicators: Grimacing;Guarding Pain Intervention(s): Monitored during session;Repositioned;Ice applied    Home Living                      Prior Function            PT Goals (current goals can now be found in the care plan  section) Progress towards PT goals: Progressing toward goals    Frequency    7X/week      PT Plan Current plan remains appropriate    Co-evaluation              AM-PAC PT "6 Clicks" Mobility   Outcome Measure  Help needed turning from your back to your side while in a flat bed without using bedrails?: None Help needed moving from lying on your back to sitting on the side of a flat bed without using bedrails?: None Help needed moving to and from a bed to a chair (including a wheelchair)?: A Little Help needed  standing up from a chair using your arms (e.g., wheelchair or bedside chair)?: A Little Help needed to walk in hospital room?: A Little Help needed climbing 3-5 steps with a railing? : A Little 6 Click Score: 20    End of Session Equipment Utilized During Treatment: Gait belt Activity Tolerance: Patient tolerated treatment well Patient left: in chair;with call bell/phone within reach Nurse Communication: Other (comment)(stable for d/c) PT Visit Diagnosis: Difficulty in walking, not elsewhere classified (R26.2)     Time: 6812-7517 PT Time Calculation (min) (ACUTE ONLY): 39 min  Charges:  $Gait Training: 8-22 mins $Therapeutic Exercise: 8-22 mins $Self Care/Home Management: Traci Drake [phone] 12/09/2018    Traci Drake 12/09/2018, 11:30 AM

## 2018-12-10 NOTE — Discharge Summary (Signed)
Physician Discharge Summary   Patient ID: Traci Drake MRN: [id number] DOB/AGE: Oct 12, 1955 63 y.o.  Admit date: 12/08/2018 Discharge date: 12/09/2018  Primary Diagnosis: Osteoarthritis, left knee   Admission Diagnoses:  Past Medical History:  Diagnosis Date  . Asthma    seasonal  . Complication of anesthesia    needed albumin in PACU. and had vertigo  . Decreased libido without sexual dysfunction   . Menopause    Discharge Diagnoses:   Principal Problem:   OA (osteoarthritis) of knee  Estimated body mass index is 28.29 kg/m as calculated from the following:   Height as of this encounter: 5\' 5"  (1.651 m).   Weight as of this encounter: 77.1 kg.  Procedure:  Procedure(s) (LRB): TOTAL KNEE ARTHROPLASTY (Left)   Consults: None  HPI: Traci KernCatherine H Carrero is a 63 y.o. year old female with end stage OA of her left knee with progressively worsening pain and dysfunction. She has constant pain, with activity and at rest and significant functional deficits with difficulties even with ADLs. She has had extensive non-op management including analgesics, injections of cortisone and viscosupplements, and home exercise program, but remains in significant pain with significant dysfunction. Radiographs show bone on bone arthritis bone on bone medial with tibial subluxation. She presents now for left Total Knee Arthroplasty.    Laboratory Data: Admission on 12/08/2018, Discharged on 12/09/2018  Component Date Value Ref Range Status  . ABO/RH(D) 12/08/2018 O POS   Final  . Antibody Screen 12/08/2018 NEG   Final  . Sample Expiration 12/08/2018    Final                   Value:12/11/2018,2359 Performed at [facility], [address] [place], [place] [address]   . ABO/RH(D) 12/08/2018    Final                   Value:O POS Performed at [facility], [address] [place], [place] [address]   . WBC 12/09/2018 10.1  4.0 - 10.5 K/uL Final  . RBC  12/09/2018 3.91  3.87 - 5.11 MIL/uL Final  . Hemoglobin 12/09/2018 12.4  12.0 - 15.0 g/dL Final  . HCT 86/57/846906/23/2020 37.9  36.0 - 46.0 % Final  . MCV 12/09/2018 96.9  80.0 - 100.0 fL Final  . MCH 12/09/2018 31.7  26.0 - 34.0 pg Final  . MCHC 12/09/2018 32.7  30.0 - 36.0 g/dL Final  . RDW 62/95/284106/23/2020 12.2  11.5 - 15.5 % Final  . Platelets 12/09/2018 215  150 - 400 K/uL Final  . nRBC 12/09/2018 0.0  0.0 - 0.2 % Final   Performed at [facility], [address] [place], [place] [address]  . Sodium 12/09/2018 139  135 - 145 mmol/L Final  . Potassium 12/09/2018 4.2  3.5 - 5.1 mmol/L Final  . Chloride 12/09/2018 109  98 - 111 mmol/L Final  . CO2 12/09/2018 23  22 - 32 mmol/L Final  . Glucose, Bld 12/09/2018 125* 70 - 99 mg/dL Final  . BUN 10/27/253606/23/2020 15  8 - 23 mg/dL Final  . Creatinine, Ser 12/09/2018 0.63  0.44 - 1.00 mg/dL Final  . Calcium 64/40/347406/23/2020 8.5* 8.9 - 10.3 mg/dL Final  . GFR calc non Af Amer 12/09/2018 >60  >60 mL/min Final  . GFR calc Af Amer 12/09/2018 >60  >60 mL/min Final  . Anion gap 12/09/2018 7  5 - 15 Final   Performed at [facility], [address] [place], [place] [address]  Hospital Outpatient Visit on 12/04/2018  Component Date Value Ref Range Status  . SARS Coronavirus 2 12/04/2018 NEGATIVE  NEGATIVE Final   Comment: (NOTE) SARS-CoV-2 target nucleic acids are NOT DETECTED. The SARS-CoV-2 RNA is generally detectable in upper and lower respiratory specimens during the acute phase of infection. Negative results do not preclude SARS-CoV-2 infection, do not rule out co-infections with other pathogens, and should not be used as the sole basis for treatment or other patient management decisions. Negative results must be combined with clinical observations, patient history, and epidemiological information. The expected result is Negative. Fact Sheet for Patients: FlowerCheck.behttps://www.fda.gov/media/136695/download Fact Sheet for Healthcare  Providers: https://www.smith-hall.com/https://www.fda.gov/media/136692/download This test is not yet approved or cleared by the [place] FDA and  has been authorized for detection and/or diagnosis of SARS-CoV-2 by FDA under an Emergency Use Authorization (EUA). This EUA will remain  in effect (meaning this test can be used) for the duration of the COVID-19 declaration under Section 56                          4(b)(1) of the Act, 21 U.S.C. section 360bbb-3(b)(1), unless the authorization is terminated or revoked sooner. Performed at [facility], [address] [place], [place] [address]   Hospital Outpatient Visit on 11/26/2018  Component Date Value Ref Range Status  . aPTT 11/26/2018 29  24 - 36 seconds Final   Performed at [facility], [address] [place], [place] [address]  . WBC 11/26/2018 4.6  4.0 - 10.5 K/uL Final  . RBC 11/26/2018 4.85  3.87 - 5.11 MIL/uL Final  . Hemoglobin 11/26/2018 15.0  12.0 - 15.0 g/dL Final  . HCT 84/16/606306/03/2019 46.5* 36.0 - 46.0 % Final  . MCV 11/26/2018 95.9  80.0 - 100.0 fL Final  . MCH 11/26/2018 30.9  26.0 - 34.0 pg Final  . MCHC 11/26/2018 32.3  30.0 - 36.0 g/dL Final  . RDW 01/60/109306/03/2019 12.1  11.5 - 15.5 % Final  . Platelets 11/26/2018 242  150 - 400 K/uL Final  . nRBC 11/26/2018 0.0  0.0 - 0.2 % Final   Performed at [facility], [address] [place], [place] [address]  . Sodium 11/26/2018 138  135 - 145 mmol/L Final  . Potassium 11/26/2018 4.7  3.5 - 5.1 mmol/L Final  . Chloride 11/26/2018 106  98 - 111 mmol/L Final  . CO2 11/26/2018 23  22 - 32 mmol/L Final  . Glucose, Bld 11/26/2018 90  70 - 99 mg/dL Final  . BUN 32/20/254206/03/2019 18  8 - 23 mg/dL Final  . Creatinine, Ser 11/26/2018 0.79  0.44 - 1.00 mg/dL Final  . Calcium 70/62/376206/03/2019 9.0  8.9 - 10.3 mg/dL Final  . Total Protein 11/26/2018 7.2  6.5 - 8.1 g/dL Final  . Albumin 83/15/176106/03/2019 4.1  3.5 - 5.0 g/dL Final  . AST 60/73/710606/03/2019 23  15 - 41 U/L Final  . ALT 11/26/2018  23  0 - 44 U/L Final  . Alkaline Phosphatase 11/26/2018 166* 38 - 126 U/L Final  . Total Bilirubin 11/26/2018 0.4  0.3 - 1.2 mg/dL Final  . GFR calc non Af Amer 11/26/2018 >60  >60 mL/min Final  . GFR calc Af Amer 11/26/2018 >60  >60 mL/min Final  . Anion gap 11/26/2018 9  5 - 15 Final   Performed at [facility], [address], [place], [place] [address]  . Prothrombin Time 11/26/2018 11.6  11.4 - 15.2 seconds Final  . INR 11/26/2018 0.9  0.8 - 1.2 Final   Comment: (NOTE) INR goal varies based on device and disease states. Performed at [facility], [address], [place], [place] [address]   . MRSA, PCR 11/26/2018 NEGATIVE  NEGATIVE Final  . Staphylococcus aureus 11/26/2018 NEGATIVE  NEGATIVE Final   Comment: (NOTE) The Xpert SA Assay (FDA approved for NASAL specimens in patients 52 years of age and older), is one component of a comprehensive surveillance program. It is not intended to diagnose infection nor to guide or monitor treatment. Performed at [facility], [address] [place], [place] [address]      X-Rays:No results found.  EKG: Orders placed or performed in visit on 10/27/07  . EKG 12-Lead     Hospital Course: ATIRA BORELLO is a 63 y.o. who was admitted to [facility]. They were brought to the operating room on 12/08/2018 and underwent Procedure(s): TOTAL KNEE ARTHROPLASTY.  Patient tolerated the procedure well and was later transferred to the recovery room and then to the orthopaedic floor for postoperative care. They were given PO and IV analgesics for pain control following their surgery. They were given 24 hours of postoperative antibiotics of  Anti-infectives (From admission, onward)   Start     Dose/Rate Route Frequency Ordered Stop   12/08/18 1400  ceFAZolin (ANCEF) IVPB 1 g/50 mL premix     1 g 100 mL/hr over 30 Minutes Intravenous Every 6 hours 12/08/18 1111 12/08/18 2100    12/08/18 0615  ceFAZolin (ANCEF) IVPB 2g/100 mL premix     2 g 200 mL/hr over 30 Minutes Intravenous On call to O.R. 12/08/18 8469 12/08/18 0816     and started on DVT prophylaxis in the form of Aspirin.   PT and OT were ordered for total joint protocol. Discharge planning consulted to help with postop disposition and equipment needs.  Patient had a good night on the evening of surgery. They started to get up OOB with therapy on POD #0. Pt was seen during rounds and was ready to go home pending progress with therapy. Hemovac drain was pulled without difficulty. She worked with therapy on POD #1 and was meeting her goals. Pt was discharged to home later that day in stable condition.  Diet: Regular diet Activity: WBAT Follow-up: in 2 weeks Disposition: Home with outpatient PT at [facility] in [place] Discharged Condition: stable   Discharge Instructions    Call MD / Call 911   Complete by: As directed    If you experience chest pain or shortness of breath, CALL 911 and be transported to the hospital emergency room.  If you develope a fever above 101 F, pus (white drainage) or increased drainage or redness at the wound, or calf pain, call your surgeon's office.   Change dressing   Complete by: As directed    Change dressing on Wednesday, then change the dressing daily with sterile 4 x 4 inch gauze dressing and apply TED hose.   Constipation Prevention   Complete by: As directed    Drink plenty of fluids.  Prune juice may be helpful.  You may use a stool softener, such as Colace (over the counter) 100 mg twice a day.  Use MiraLax (over the counter) for constipation as needed.   Diet - low sodium heart healthy   Complete by: As directed  Discharge instructions   Complete by: As directed    Dr. Ollen Gross Total Joint Specialist [facility] [address] [place], [place] [phone]  TOTAL KNEE REPLACEMENT POSTOPERATIVE DIRECTIONS  Knee Rehabilitation,  Guidelines Following Surgery  Results after knee surgery are often greatly improved when you follow the exercise, range of motion and muscle strengthening exercises prescribed by your doctor. Safety measures are also important to protect the knee from further injury. Any time any of these exercises cause you to have increased pain or swelling in your knee joint, decrease the amount until you are comfortable again and slowly increase them. If you have problems or questions, call your caregiver or physical therapist for advice.   HOME CARE INSTRUCTIONS  Remove items at home which could result in a fall. This includes throw rugs or furniture in walking pathways.  ICE to the affected knee every three hours for 30 minutes at a time and then as needed for pain and swelling.  Continue to use ice on the knee for pain and swelling from surgery. You may notice swelling that will progress down to the foot and ankle.  This is normal after surgery.  Elevate the leg when you are not up walking on it.   Continue to use the breathing machine which will help keep your temperature down.  It is common for your temperature to cycle up and down following surgery, especially at night when you are not up moving around and exerting yourself.  The breathing machine keeps your lungs expanded and your temperature down. Do not place pillow under knee, focus on keeping the knee straight while resting   DIET You may resume your previous home diet once your are discharged from the hospital.  DRESSING / WOUND CARE / SHOWERING You may change your dressing 3-5 days after surgery.  Then change the dressing every day with sterile gauze.  Please use good hand washing techniques before changing the dressing.  Do not use any lotions or creams on the incision until instructed by your surgeon. You may start showering once you are discharged home but do not submerge the incision under water. Just pat the incision dry and apply a dry gauze  dressing on daily. Change the surgical dressing daily and reapply a dry dressing each time.  ACTIVITY Walk with your walker as instructed. Use walker as long as suggested by your caregivers. Avoid periods of inactivity such as sitting longer than an hour when not asleep. This helps prevent blood clots.  You may resume a sexual relationship in one month or when given the OK by your doctor.  You may return to work once you are cleared by your doctor.  Do not drive a car for 6 weeks or until released by you surgeon.  Do not drive while taking narcotics.  WEIGHT BEARING Weight bearing as tolerated with assist device (walker, cane, etc) as directed, use it as long as suggested by your surgeon or therapist, typically at least 4-6 weeks.  POSTOPERATIVE CONSTIPATION PROTOCOL Constipation - defined medically as fewer than three stools per week and severe constipation as less than one stool per week.  One of the most common issues patients have following surgery is constipation.  Even if you have a regular bowel pattern at home, your normal regimen is likely to be disrupted due to multiple reasons following surgery.  Combination of anesthesia, postoperative narcotics, change in appetite and fluid intake all can affect your bowels.  In order  to avoid complications following surgery, here are some recommendations in order to help you during your recovery period.  Colace (docusate) - Pick up an over-the-counter form of Colace or another stool softener and take twice a day as long as you are requiring postoperative pain medications.  Take with a full glass of water daily.  If you experience loose stools or diarrhea, hold the colace until you stool forms back up.  If your symptoms do not get better within 1 week or if they get worse, check with your doctor.  Dulcolax (bisacodyl) - Pick up over-the-counter and take as directed by the product packaging as needed to assist with the movement of your bowels.  Take  with a full glass of water.  Use this product as needed if not relieved by Colace only.   MiraLax (polyethylene glycol) - Pick up over-the-counter to have on hand.  MiraLax is a solution that will increase the amount of water in your bowels to assist with bowel movements.  Take as directed and can mix with a glass of water, juice, soda, coffee, or tea.  Take if you go more than two days without a movement. Do not use MiraLax more than once per day. Call your doctor if you are still constipated or irregular after using this medication for 7 days in a row.  If you continue to have problems with postoperative constipation, please contact the office for further assistance and recommendations.  If you experience "the worst abdominal pain ever" or develop nausea or vomiting, please contact the office immediatly for further recommendations for treatment.  ITCHING  If you experience itching with your medications, try taking only a single pain pill, or even half a pain pill at a time.  You can also use Benadryl over the counter for itching or also to help with sleep.   TED HOSE STOCKINGS Wear the elastic stockings on both legs for three weeks following surgery during the day but you may remove then at night for sleeping.  MEDICATIONS See your medication summary on the "After Visit Summary" that the nursing staff will review with you prior to discharge.  You may have some home medications which will be placed on hold until you complete the course of blood thinner medication.  It is important for you to complete the blood thinner medication as prescribed by your surgeon.  Continue your approved medications as instructed at time of discharge.  PRECAUTIONS If you experience chest pain or shortness of breath - call 911 immediately for transfer to the hospital emergency department.  If you develop a fever greater that 101 F, purulent drainage from wound, increased redness or drainage from wound, foul odor from  the wound/dressing, or calf pain - CONTACT YOUR SURGEON.                                                   FOLLOW-UP APPOINTMENTS Make sure you keep all of your appointments after your operation with your surgeon and caregivers. You should call the office at the above phone number and make an appointment for approximately two weeks after the date of your surgery or on the date instructed by your surgeon outlined in the "After Visit Summary".   RANGE OF MOTION AND STRENGTHENING EXERCISES  Rehabilitation of the knee is important following a knee injury or an operation.  After just a few days of immobilization, the muscles of the thigh which control the knee become weakened and shrink (atrophy). Knee exercises are designed to build up the tone and strength of the thigh muscles and to improve knee motion. Often times heat used for twenty to thirty minutes before working out will loosen up your tissues and help with improving the range of motion but do not use heat for the first two weeks following surgery. These exercises can be done on a training (exercise) mat, on the floor, on a table or on a bed. Use what ever works the best and is most comfortable for you Knee exercises include:  Leg Lifts - While your knee is still immobilized in a splint or cast, you can do straight leg raises. Lift the leg to 60 degrees, hold for 3 sec, and slowly lower the leg. Repeat 10-20 times 2-3 times daily. Perform this exercise against resistance later as your knee gets better.  Quad and Hamstring Sets - Tighten up the muscle on the front of the thigh (Quad) and hold for 5-10 sec. Repeat this 10-20 times hourly. Hamstring sets are done by pushing the foot backward against an object and holding for 5-10 sec. Repeat as with quad sets.  Leg Slides: Lying on your back, slowly slide your foot toward your buttocks, bending your knee up off the floor (only go as far as is comfortable). Then slowly slide your foot back down until your  leg is flat on the floor again. Angel Wings: Lying on your back spread your legs to the side as far apart as you can without causing discomfort.  A rehabilitation program following serious knee injuries can speed recovery and prevent re-injury in the future due to weakened muscles. Contact your doctor or a physical therapist for more information on knee rehabilitation.   IF YOU ARE TRANSFERRED TO A SKILLED REHAB FACILITY If the patient is transferred to a skilled rehab facility following release from the hospital, a list of the current medications will be sent to the facility for the patient to continue.  When discharged from the skilled rehab facility, please have the facility set up the patient's Home Health Physical Therapy prior to being released. Also, the skilled facility will be responsible for providing the patient with their medications at time of release from the facility to include their pain medication, the muscle relaxants, and their blood thinner medication. If the patient is still at the rehab facility at time of the two week follow up appointment, the skilled rehab facility will also need to assist the patient in arranging follow up appointment in our office and any transportation needs.  MAKE SURE YOU:  Understand these instructions.  Get help right away if you are not doing well or get worse.    Pick up stool softner and laxative for home use following surgery while on pain medications. Do not submerge incision under water. Please use good hand washing techniques while changing dressing each day. May shower starting three days after surgery. Please use a clean towel to pat the incision dry following showers. Continue to use ice for pain and swelling after surgery. Do not use any lotions or creams on the incision until instructed by your surgeon.   Do not put a pillow under the knee. Place it under the heel.   Complete by: As directed    Driving restrictions   Complete by: As  directed    No driving for two weeks  TED hose   Complete by: As directed    Use stockings (TED hose) for three weeks on both leg(s).  You may remove them at night for sleeping.   Weight bearing as tolerated   Complete by: As directed      Allergies as of 12/09/2018      Reactions   Hydrocodone-acetaminophen Hives   Kiwi Extract Anaphylaxis   Erythromycin Base Swelling   Tolerates po, reaction was caused by an eye ointment    Tetracycline Swelling, Rash      Medication List    STOP taking these medications   ibuprofen 800 MG tablet Commonly known as: ADVIL   meloxicam 15 MG tablet Commonly known as: MOBIC     TAKE these medications   albuterol 108 (90 Base) MCG/ACT inhaler Commonly known as: VENTOLIN HFA Inhale 2 puffs into the lungs every 6 (six) hours as needed for wheezing or shortness of breath.   aspirin 325 MG EC tablet Take 1 tablet (325 mg total) by mouth 2 (two) times daily for 20 days. Then take one 81 mg aspirin once a day for three weeks. Then discontinue aspirin.   gabapentin 300 MG capsule Commonly known as: NEURONTIN Take 300-600 mg by mouth See admin instructions. Take 300 mg in the morning and 600 mg at night   methocarbamol 500 MG tablet Commonly known as: ROBAXIN Take 1 tablet (500 mg total) by mouth every 6 (six) hours as needed for muscle spasms.   oxyCODONE 5 MG immediate release tablet Commonly known as: Oxy IR/ROXICODONE Take 1-2 tablets (5-10 mg total) by mouth every 6 (six) hours as needed for severe pain.   traMADol 50 MG tablet Commonly known as: ULTRAM Take 1-2 tablets (50-100 mg total) by mouth every 6 (six) hours as needed for moderate pain.            Discharge Care Instructions  (From admission, onward)         Start     Ordered   12/09/18 0000  Weight bearing as tolerated     12/09/18 0749   12/09/18 0000  Change dressing    Comments: Change dressing on Wednesday, then change the dressing daily with sterile 4 x 4  inch gauze dressing and apply TED hose.   12/09/18 0749         Follow-up Information    Gaynelle Arabian, MD. Schedule an appointment as soon as possible for a visit on 12/23/2018.   Specialty: Orthopedic Surgery Contact information: [address] [place] [address] [phone]           Signed: Theresa Duty, PA-C Orthopedic Surgery 12/10/2018, 7:39 AM

## 2018-12-11 ENCOUNTER — Other Ambulatory Visit: Payer: Self-pay

## 2018-12-11 ENCOUNTER — Encounter: Payer: Self-pay | Admitting: Physical Therapy

## 2018-12-11 ENCOUNTER — Ambulatory Visit: Attending: Student | Admitting: Physical Therapy

## 2018-12-11 DIAGNOSIS — M25562 Pain in left knee: Secondary | ICD-10-CM | POA: Insufficient documentation

## 2018-12-11 DIAGNOSIS — G8929 Other chronic pain: Secondary | ICD-10-CM | POA: Insufficient documentation

## 2018-12-11 DIAGNOSIS — M6281 Muscle weakness (generalized): Secondary | ICD-10-CM | POA: Diagnosis present

## 2018-12-11 NOTE — Therapy (Addendum)
[facility]ICINE [address] [place], [place], [address] Phone: [phone]   Fax:  [phone]  Physical Therapy Treatment  Patient Details  Name: Traci Drake MRN: [id number] Date of Birth: [birth year] Referring Provider (PT): [name]   Encounter Date: 12/11/2018  PT End of Session - 12/11/18 1048    Visit Number  1    Number of Visits  17    Date for PT Re-Evaluation  02/05/19    PT Start Time  0930    PT Stop Time  1017    PT Time Calculation (min)  47 min    Equipment Utilized During Treatment  Gait belt    Activity Tolerance  Patient tolerated treatment well    Behavior During Therapy  [facility] for tasks assessed/performed       Past Medical History:  Diagnosis Date  . Asthma    seasonal  . Complication of anesthesia    needed albumin in PACU. and had vertigo  . Decreased libido without sexual dysfunction   . Menopause     Past Surgical History:  Procedure Laterality Date  . APPENDECTOMY  1989  . BREAST BIOPSY  2003  . KNEE ARTHROSCOPY Left    6-8 years ago  . 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  Subjective Assessment - 12/11/18 0944    Subjective  Patient with 10/10 pain on arrival, with husband, does not think she can transfer from car into clinic. Patient is tearful. [facility]    Pertinent History  patient is a [age] year old female, SP L TKA 12/08/18. 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 Patient had previously been working out a few days a week, but has not since gyms have been closed. Enjoys gardening. She  is currently ambulating with RW around her home, that she reports she is able to do. Most pain with transitional movements STS, transferring in/out of the car, and negotiating stairs. Patient reports she has a 6in step up into the shower which is hard for her to use, but once she is in the shower she can stand and support herself to bathe.She has 2 steps to enter/exit her home without handrails. Reports 10/10 pain with transitional movements, 3/10 at best.    Limitations  Sitting;Lifting;Standing;Walking;House hold activities    How long can you sit comfortably?  takes a while to get comfortable, once comfortable can sit for an hour    How long can you stand comfortably?  10mins    How long can you walk comfortably?  couple minutes to bathroom and back    Patient Stated Goals  Decrease pain to return to PLOF    Currently in Pain?  Yes    Pain Score  10-Worst pain ever    Pain Location  Knee    Pain Orientation  Left    Pain Descriptors / Indicators  Burning;Aching;Spasm    Pain Type  Surgical pain    Pain Radiating Towards  None    Pain Onset  In the past 7 days  Aggravating Factors   STS, stair negotation    Pain Relieving Factors  Pain medication, rest    Effect of Pain on Daily Activities  unable to complete ADLs              OBJECTIVE  MUSCULOSKELETAL: Tremor: Absent Bulk: Normal Tone: Normal, no spasticity, rigidity, or clonus No trophic changes noted to lower extremities. No ecchymosis, erythema, or edema noted around knee. No gross knee deformity noted  Posture No gross abnormalities noted in standing or seated posture  Lumbar/Hip Unable to fully assess hip motion, seems to be WNL  Gait Step to gait with RW. Decreased RLE step length, and decreased LLE stance time  Palpation Pain to palpation with incision site wrapped. Unable to fully assess patella mobilization d/t muscle guarding likely d/t pain. Will attempt to assess this  Strength R/L 5/3- Hip  flexion 5/3 Hip external rotation 5/3 Hip internal rotation Unable to lay on stomach Hip extension  unable/3- Hip abduction 5/unable Knee extension 5/unable Knee flexion 5/3 Ankle Dorsiflexion 5/3 Ankle Plantarflexion 5/5 Ankle Inversion 5/5 Ankle Eversion MMT assessment limited by pain as patient is having pain with all active motion   AROM Knee R/L Flexion: 47 * Extension: -12 * *indicates pain  NEUROLOGICAL:  Mental Status Patient is oriented to person, place and time.  Recent memory is intact.  Remote memory is intact.  Attention span and concentration are intact.  Expressive speech is intact.  Patient's fund of knowledge is within normal limits for educational level.  Sensation Grossly intact to light touch bilateral LEs as determined by testing dermatomes L2-S2 Proprioception and hot/cold testing deferred on this date  VASCULAR Dorsalis pedis and posterior tibial pulses are palpable  Therapeutic Activity Spent majority of evaluation ensuring safety with transfers, ambulation and stair training for safety in home as patient is tearful in pain, and is having 10/10 pain negotiating her home. Completed stair training on 2 steps with RW with backward step up with RLE leading ascent; LLE leading descent forward with good carry over and safety with supervision following demo Backward step over 6in hurdle to mimic stepping over small shower step with CGA for safety and backward RLE step and R SL squat with lift to clear LLE Trials of STS with demo and cuing to reach backward for chair to utilize tricep dip with RLE squat with LLE out in front to complete transfer with less pain; good carry over following Gait training with RW, unable to complete without step to pattern, but with good safety and no pain over 7575ft Car transfer in and out with modA with LLE d/t pain. Pt able to demonstrate good carry over of proper transfer technique with stand > car with backward technique.  Patient tearful following transfers Review of current HEP from hospital with education on importance of early motion, highlighting flex and ext therex                    PT Education - 12/11/18 1033    Education Details  Patient was educated on diagnosis, anatomy and pathology involved, prognosis, role of PT, and was given an HEP, demonstrating exercise with proper form following verbal and tactile cues, and was given a paper hand out to continue exercise at home. Pt was educated on and agreed to plan of care.    Person(s) Educated  Patient    Methods  Explanation;Demonstration;Tactile cues;Verbal cues;Handout    Comprehension  Verbalized understanding;Returned demonstration;Verbal cues required;Tactile cues required  PT Short Term Goals - 12/11/18 1118      PT SHORT TERM GOAL #1   Title  Pt will be independent with HEP in order to decrease knee pain and increase strength in order to improve pain-free function at home and work.    Time  4    Period  Weeks    Status  New        PT Long Term Goals - 12/11/18 1119      PT LONG TERM GOAL #1   Title  Patient will be able to complete 5 STS in 10seconds to demonstrate normal LE strength    Baseline  12/11/18 7 sec to complete 1 STS with 10/10 pain    Time  8    Period  Weeks    Status  New      PT LONG TERM GOAL #2   Title  Pt will decrease worst pain as reported on NPRS by at least 3 points in order to demonstrate clinically significant reduction in ankle/foot pain.    Baseline  12/11/18 10/10 pain with transfers    Time  8    Period  Weeks    Status  New      PT LONG TERM GOAL #3   Title  Pt will increase 10MWT to at least 1.4 m/s in order to demonstrate normal community ambulation, with normalized gait pattern w/o AD to return to PLOF    Baseline  12/11/18 0.70m/s with step to pattern with RW    Time  8    Period  Weeks    Status  New      PT LONG TERM GOAL #4   Title  Patient will demonstrate normalized  stair negotiation up 2 steps without handrail to safely enter and exit home    Baseline  12/11/18 Able to ambulate backward with RW and step to gait leading with RLE    Time  8    Period  Weeks    Status  New            Plan - 12/11/18 1049    Clinical Impression Statement  Patient is a pleasant 63 year old female s/p L TKA 12/08/18. Patient with impairments in knee ROM, strength, activity tolerance, pain, and motor control. 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 Would benefit from skilled PT to address above deficits and promote optimal return to PLOF    Personal Factors and Comorbidities  Age;Comorbidity 1;Past/Current Experience;Sex    Comorbidities  OA, previous sx on L knee    Examination-Activity Limitations  Bathing;Squat;Lift;Stairs;Bed Mobility;Bend;Locomotion Level;Stand;Carry;Transfers;Sit;Sleep    Examination-Participation Restrictions  Cleaning;Laundry;Community Activity;Driving    Stability/Clinical Decision Making  Evolving/Moderate complexity    Clinical Decision Making  Moderate    Rehab Potential  Good    PT Frequency  2x / week    PT Duration  8 weeks    PT Treatment/Interventions  Aquatic Therapy;Electrical Stimulation;Cryotherapy;Iontophoresis 4mg /ml Dexamethasone;Moist Heat;DME Instruction;Functional mobility training;Therapeutic activities;Neuromuscular re-education;Dry needling;Passive range of motion;Joint Manipulations;Taping;Manual techniques;Therapeutic exercise;Gait training;Stair training;Patient/family education    PT Next Visit Plan  Mobility    PT Home Exercise Plan  Continue HEP quad sets, supine abd, heel slides, knee ext    Consulted and Agree with Plan of Care  Patient       Patient will benefit from skilled therapeutic intervention in order to improve the following deficits  and impairments:   Abnormal gait, Decreased activity tolerance, Decreased coordination, Decreased strength, Increased fascial restricitons, Impaired flexibility, Pain, Postural dysfunction, Improper body mechanics, Decreased range of motion, Difficulty walking, Decreased endurance, Decreased mobility, Increased muscle spasms  Visit Diagnosis: 1. Acute pain of left knee        Problem List Patient Active Problem List   Diagnosis Date Noted  . OA (osteoarthritis) of knee 12/08/2018  . Asthma, exercise induced 09/20/2014  . Overweight 09/20/2014   Staci Acostahelsea Miller PT, DPT Staci Acostahelsea Miller 12/11/2018, 5:35 PM  [facility] [address] [place], [place], [address] Phone: [phone]   Fax:  [phone]  Name: Traci Drake MRN: [id number] Date of Birth: 22-Jul-1955

## 2018-12-15 ENCOUNTER — Other Ambulatory Visit: Payer: Self-pay

## 2018-12-15 ENCOUNTER — Encounter: Payer: Self-pay | Admitting: Physical Therapy

## 2018-12-15 ENCOUNTER — Ambulatory Visit: Admitting: Physical Therapy

## 2018-12-15 DIAGNOSIS — M6281 Muscle weakness (generalized): Secondary | ICD-10-CM

## 2018-12-15 DIAGNOSIS — M25562 Pain in left knee: Secondary | ICD-10-CM

## 2018-12-15 DIAGNOSIS — G8929 Other chronic pain: Secondary | ICD-10-CM

## 2018-12-15 NOTE — Therapy (Signed)
[facility] [address] [place], [place], [address] Phone: [phone]   Fax:  [phone]  Physical Therapy Treatment  Patient Details  Name: Traci Drake MRN: [id number] Date of Birth: April 21, 1956 Referring Provider (PT): [name]   Encounter Date: 12/15/2018  PT End of Session - 12/15/18 1319    Visit Number  2    Number of Visits  17    Date for PT Re-Evaluation  02/05/19    PT Start Time  1130    PT Stop Time  1215    PT Time Calculation (min)  45 min    Equipment Utilized During Treatment  Gait belt    Activity Tolerance  Patient tolerated treatment well    Behavior During Therapy  [facility] for tasks assessed/performed       Past Medical History:  Diagnosis Date  . Asthma    seasonal  . Complication of anesthesia    needed albumin in PACU. and had vertigo  . Decreased libido without sexual dysfunction   . Menopause     Past Surgical History:  Procedure Laterality Date  . APPENDECTOMY  1989  . BREAST BIOPSY  2003  . KNEE ARTHROSCOPY Left    6-8 years ago  . 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  Subjective Assessment - 12/15/18 1127    Subjective  Patient reports she was swinging her LLE onto the couch last night and had a pain over the knee cap (points to the quadricep) that has not gone away. Reports 6/10 pain to date. Reports compliance with HEP.    Pertinent History  patient is a 63 year old female, SP L TKA 12/08/18. 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 Patient had previously been working out a few days a week, but has not since gyms have been  closed. Enjoys gardening. She is currently ambulating with RW around her home, that she reports she is able to do. Most pain with transitional movements STS, transferring in/out of the car, and negotiating stairs. Patient reports she has a 6in step up into the shower which is hard for her to use, but once she is in the shower she can stand and support herself to bathe.She has 2 steps to enter/exit her home without handrails. Reports 10/10 pain with transitional movements, 3/10 at best.    How long can you sit comfortably?  takes a while to get comfortable, once comfortable can sit for an hour    How long can you stand comfortably?  10mins    How long can you walk comfortably?  couple minutes to bathroom and back    Diagnostic tests  Serial radiographs, no problems with components as reported by patient    Patient Stated Goals  Decrease pain to return to PLOF          Manual - patella mobilization sup/inf and medial lateral 5mins with increased sensitivity initially.  - PROM flex and ext 5x 10sec hold each position  - Progressive knee flex in sitting with therapist progressing support of heel until LE is hanging off mat table  Progressive knee ext in supine progression LE support to eventually leave patient in heel prop STM with trigger point release to L hamstring group  Ther-Ex - Ext prop stretch x41min hold; education on low load long duration stretching - Heel slide in sitting 3x 30sec hold  - Attempted LAQ, patient unable to complete; SAQ 3x 5 with cuing for full availible ROM, some AAROM occasionally at end range - Minimal range SLR 3x 5 with some AAROM (69finger assist for initiaion) cuing for availible eccentric control  Education on increasing duration of stretching at home from 10sec to 30-60sec holds  Flex 103 Ext -22d                     PT Education - 12/15/18 1203    Education provided  Yes    Education Details  HEP update; therex form    Person(s) Educated   Patient    Methods  Explanation;Demonstration;Tactile cues;Verbal cues    Comprehension  Verbalized understanding;Tactile cues required;Returned demonstration;Verbal cues required       PT Short Term Goals - 12/11/18 1118      PT SHORT TERM GOAL #1   Title  Pt will be independent with HEP in order to decrease knee pain and increase strength in order to improve pain-free function at home and work.    Time  4    Period  Weeks    Status  New        PT Long Term Goals - 12/11/18 1119      PT LONG TERM GOAL #1   Title  Patient will be able to complete 5 STS in 10seconds to demonstrate normal LE strength    Baseline  12/11/18 7 sec to complete 1 STS with 10/10 pain    Time  8    Period  Weeks    Status  New      PT LONG TERM GOAL #2   Title  Pt will decrease worst pain as reported on NPRS by at least 3 points in order to demonstrate clinically significant reduction in ankle/foot pain.    Baseline  12/11/18 10/10 pain with transfers    Time  8    Period  Weeks    Status  New      PT LONG TERM GOAL #3   Title  Pt will increase 10MWT to at least 1.4 m/s in order to demonstrate normal community ambulation, with normalized gait pattern w/o AD to return to PLOF    Baseline  12/11/18 0.65m/s with step to pattern with RW    Time  8    Period  Weeks    Status  New      PT LONG TERM GOAL #4   Title  Patient will demonstrate normalized stair negotiation up 2 steps without handrail to safely enter and exit home    Baseline  12/11/18 Able to ambulate backward with RW and step to gait leading with RLE    Time  8    Period  Weeks    Status  New            Plan - 12/15/18 1320    Clinical Impression Statement  utilize manual and therex techniques to increase ROM and muscle activation within patient's pain tolerance.    Personal Factors and Comorbidities  Age;Comorbidity 1;Past/Current Experience;Sex    Comorbidities  OA, previous sx on L knee    Examination-Activity Limitations   Bathing;Squat;Lift;Stairs;Bed Mobility;Bend;Locomotion Level;Stand;Carry;Transfers;Sit;Sleep  Examination-Participation Restrictions  Cleaning;Laundry;Community Activity;Driving    Stability/Clinical Decision Making  Evolving/Moderate complexity    Clinical Decision Making  Moderate    Rehab Potential  Good    Clinical Impairments Affecting Rehab Potential  Positive: motivation, age, partial replacement, active; Negative: history of L knee problems    PT Frequency  2x / week    PT Duration  8 weeks    PT Treatment/Interventions  Aquatic Therapy;Electrical Stimulation;Cryotherapy;Iontophoresis 4mg /ml Dexamethasone;Moist Heat;DME Instruction;Functional mobility training;Therapeutic activities;Neuromuscular re-education;Dry needling;Passive range of motion;Joint Manipulations;Taping;Manual techniques;Therapeutic exercise;Gait training;Stair training;Patient/family education    PT Next Visit Plan  Mobility    PT Home Exercise Plan  Continue HEP quad sets, supine abd, heel slides, knee ext    Consulted and Agree with Plan of Care  Patient       Patient will benefit from skilled therapeutic intervention in order to improve the following deficits and impairments:  Abnormal gait, Decreased activity tolerance, Decreased coordination, Decreased strength, Increased fascial restricitons, Impaired flexibility, Pain, Postural dysfunction, Improper body mechanics, Decreased range of motion, Difficulty walking, Decreased endurance, Decreased mobility, Increased muscle spasms  Visit Diagnosis: 1. Acute pain of left knee   2. Chronic pain of left knee   3. Muscle weakness (generalized)        Problem List Patient Active Problem List   Diagnosis Date Noted  . OA (osteoarthritis) of knee 12/08/2018  . Asthma, exercise induced 09/20/2014  . Overweight 09/20/2014   Staci Acostahelsea Miller PT, DPT Staci Acostahelsea Miller 12/15/2018, 1:40 PM  [facility]  [facility] [address] [place], [place], [address] Phone: [phone]   Fax:  [phone]  Name: Traci Drake MRN: [id number] Date of Birth: [birth year]

## 2018-12-17 ENCOUNTER — Ambulatory Visit: Admitting: Physical Therapy

## 2018-12-22 ENCOUNTER — Other Ambulatory Visit: Payer: Self-pay

## 2018-12-22 ENCOUNTER — Encounter: Payer: Self-pay | Admitting: Physical Therapy

## 2018-12-22 ENCOUNTER — Ambulatory Visit: Attending: Student | Admitting: Physical Therapy

## 2018-12-22 DIAGNOSIS — G8929 Other chronic pain: Secondary | ICD-10-CM | POA: Insufficient documentation

## 2018-12-22 DIAGNOSIS — M25561 Pain in right knee: Secondary | ICD-10-CM | POA: Diagnosis present

## 2018-12-22 DIAGNOSIS — M25562 Pain in left knee: Secondary | ICD-10-CM | POA: Diagnosis present

## 2018-12-22 DIAGNOSIS — M6281 Muscle weakness (generalized): Secondary | ICD-10-CM | POA: Insufficient documentation

## 2018-12-22 NOTE — Therapy (Signed)
[facility] [address] [place], [place], [address] Phone: [phone]   Fax:  [phone]  Physical Therapy Treatment  Patient Details  Name: Traci Drake MRN: [id number] Date of Birth: 23-Jul-1955 Referring Provider (PT): [name]   Encounter Date: 12/22/2018  PT End of Session - 12/22/18 1201    Visit Number  3    Number of Visits  17    Date for PT Re-Evaluation  02/05/19    PT Start Time  1130    PT Stop Time  1215    PT Time Calculation (min)  45 min    Equipment Utilized During Treatment  Gait belt    Activity Tolerance  Patient tolerated treatment well    Behavior During Therapy  [facility] for tasks assessed/performed       Past Medical History:  Diagnosis Date  . Asthma    seasonal  . Complication of anesthesia    needed albumin in PACU. and had vertigo  . Decreased libido without sexual dysfunction   . Menopause     Past Surgical History:  Procedure Laterality Date  . APPENDECTOMY  1989  . BREAST BIOPSY  2003  . KNEE ARTHROSCOPY Left    6-8 years ago  . 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  Subjective Assessment - 12/22/18 1133    Subjective  Pt reports she as stopped oxycodone, and reports minimal pain today, with tramadol only. Reports compliance with HEP, with good increased hold for stretches.    Pertinent History  patient is a 63 year old female, SP L TKA 12/08/18. 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 Patient had previously been working out a few days a week, but has not since gyms have been closed. Enjoys gardening. She is currently ambulating  with RW around her home, that she reports she is able to do. Most pain with transitional movements STS, transferring in/out of the car, and negotiating stairs. Patient reports she has a 6in step up into the shower which is hard for her to use, but once she is in the shower she can stand and support herself to bathe.She has 2 steps to enter/exit her home without handrails. Reports 10/10 pain with transitional movements, 3/10 at best.    How long can you sit comfortably?  takes a while to get comfortable, once comfortable can sit for an hour    How long can you stand comfortably?  10mins    How long can you walk comfortably?  couple minutes to bathroom and back    Diagnostic tests  Serial radiographs, no problems with components as reported by patient    Patient Stated Goals  Decrease pain to return to PLOF       Manual - patella mobilization sup/inf and medial lateral 5mins with increased sensitivity initially.  - PROM flex and ext 5x 10sec hold each position  - Progressive knee flex in sitting with therapist progressing support of heel until LE is hanging off mat table  Ther-Ex - Ext prop stretch x511min hold; education on low load long duration stretching - Heel  slide in sitting 3x 25sec hold  - SLR with focus on quad activation 3x 10 with noted quad lag, but pt able to maintain good quad activation throughout - Seated knee hang 74min - LAQ 3x 10 with good quad activation following min cuing - Wt shift L and R to toe tap with UE support, with 2 finger support, with no support; standing on LLE 10sec x2 - Standing hip abd 2x 10 with min cuing for initial set up with good carry over following - Mini squat with UE support 2x 8 with TC for even wt bearing with good carry over following   Flex 90 Ext -22d                        PT Education - 12/22/18 1156    Education provided  Yes    Education Details  therex    [employer]) Educated  Patient    Methods   Explanation;Demonstration;Tactile cues;Verbal cues    Comprehension  Verbalized understanding;Returned demonstration;Verbal cues required;Tactile cues required       PT Short Term Goals - 12/11/18 1118      PT SHORT TERM GOAL #1   Title  Pt will be independent with HEP in order to decrease knee pain and increase strength in order to improve pain-free function at home and work.    Time  4    Period  Weeks    Status  New        PT Long Term Goals - 12/11/18 1119      PT LONG TERM GOAL #1   Title  Patient will be able to complete 5 STS in 10seconds to demonstrate normal LE strength    Baseline  12/11/18 7 sec to complete 1 STS with 10/10 pain    Time  8    Period  Weeks    Status  New      PT LONG TERM GOAL #2   Title  Pt will decrease worst pain as reported on NPRS by at least 3 points in order to demonstrate clinically significant reduction in ankle/foot pain.    Baseline  12/11/18 10/10 pain with transfers    Time  8    Period  Weeks    Status  New      PT LONG TERM GOAL #3   Title  Pt will increase 10MWT to at least 1.4 m/s in order to demonstrate normal community ambulation, with normalized gait pattern w/o AD to return to PLOF    Baseline  12/11/18 0.11m/s with step to pattern with RW    Time  8    Period  Weeks    Status  New      PT LONG TERM GOAL #4   Title  Patient will demonstrate normalized stair negotiation up 2 steps without handrail to safely enter and exit home    Baseline  12/11/18 Able to ambulate backward with RW and step to gait leading with RLE    Time  8    Period  Weeks    Status  New            Plan - 12/22/18 1613    Clinical Impression Statement  Patient has made good progress over the weekend with motion and quad activation and ROM. PT progressed therex to include WB in preparation for cane/no AD gait training next session. PT able to make good progress this session with therex progression d/t patient pain being  much better managed. Will  continue progression toward increased WB strengthening as able.    Personal Factors and Comorbidities  Age;Comorbidity 1;Past/Current Experience;Sex    Comorbidities  OA, previous sx on L knee    Examination-Activity Limitations  Bathing;Squat;Lift;Stairs;Bed Mobility;Bend;Locomotion Level;Stand;Carry;Transfers;Sit;Sleep    Examination-Participation Restrictions  Cleaning;Laundry;Community Activity;Driving    Stability/Clinical Decision Making  Evolving/Moderate complexity    Rehab Potential  Good    Clinical Impairments Affecting Rehab Potential  Positive: motivation, age, partial replacement, active; Negative: history of L knee problems    PT Frequency  2x / week    PT Duration  8 weeks    PT Treatment/Interventions  Aquatic Therapy;Electrical Stimulation;Cryotherapy;Iontophoresis 4mg /ml Dexamethasone;Moist Heat;DME Instruction;Functional mobility training;Therapeutic activities;Neuromuscular re-education;Dry needling;Passive range of motion;Joint Manipulations;Taping;Manual techniques;Therapeutic exercise;Gait training;Stair training;Patient/family education    PT Next Visit Plan  Mobility    PT Home Exercise Plan  Continue HEP quad sets, supine abd, heel slides, knee ext    Consulted and Agree with Plan of Care  Patient       Patient will benefit from skilled therapeutic intervention in order to improve the following deficits and impairments:  Abnormal gait, Decreased activity tolerance, Decreased coordination, Decreased strength, Increased fascial restricitons, Impaired flexibility, Pain, Postural dysfunction, Improper body mechanics, Decreased range of motion, Difficulty walking, Decreased endurance, Decreased mobility, Increased muscle spasms  Visit Diagnosis: 1. Acute pain of left knee   2. Chronic pain of left knee   3. Muscle weakness (generalized)        Problem List Patient Active Problem List   Diagnosis Date Noted  . OA (osteoarthritis) of knee 12/08/2018  . Asthma,  exercise induced 09/20/2014  . Overweight 09/20/2014   Staci Acostahelsea Miller PT, DPT Staci Acostahelsea Miller 12/22/2018, 4:18 PM  [facility] [address] [place], [place], [address] Phone: [phone]   Fax:  [phone]  Name: Traci Drake MRN: [id number] Date of Birth: 18-Apr-1956

## 2018-12-23 ENCOUNTER — Encounter: Admitting: Physical Therapy

## 2018-12-24 ENCOUNTER — Encounter: Payer: Self-pay | Admitting: Physical Therapy

## 2018-12-24 ENCOUNTER — Other Ambulatory Visit: Payer: Self-pay

## 2018-12-24 ENCOUNTER — Ambulatory Visit: Admitting: Physical Therapy

## 2018-12-24 DIAGNOSIS — M25562 Pain in left knee: Secondary | ICD-10-CM

## 2018-12-24 DIAGNOSIS — M6281 Muscle weakness (generalized): Secondary | ICD-10-CM

## 2018-12-24 DIAGNOSIS — G8929 Other chronic pain: Secondary | ICD-10-CM

## 2018-12-24 NOTE — Therapy (Signed)
[facility] PHYSICAL AND SPORTS MEDICINE [address], [place], [address] Phone: [phone]   Fax:  [phone]  Physical Therapy Treatment  Patient Details  Name: Traci Drake MRN: [id number] Date of Birth: [birth year] Referring Provider (PT): Dr. Wynelle Beckmann   Encounter Date: 12/24/2018  PT End of Session - 12/24/18 1052    Visit Number  4    Number of Visits  17    Date for PT Re-Evaluation  02/05/19    PT Start Time  1045    PT Stop Time  1130    PT Time Calculation (min)  45 min    Equipment Utilized During Treatment  Gait belt    Activity Tolerance  Patient tolerated treatment well    Behavior During Therapy  [facility] for tasks assessed/performed       Past Medical History:  Diagnosis Date  . Asthma    seasonal  . Complication of anesthesia    needed albumin in PACU. and had vertigo  . Decreased libido without sexual dysfunction   . Menopause     Past Surgical History:  Procedure Laterality Date  . APPENDECTOMY  1989  . BREAST BIOPSY  2003  . KNEE ARTHROSCOPY Left    6-8 years ago  . 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  Subjective Assessment - 12/24/18 1050    Subjective  Patient reports her surgeon was "happy with her yesterday", reports 5/10 pain today. compliance with HEP.    Pertinent History  patient is a 63 year old female, SP L TKA 12/08/18. 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 Patient had previously been working out a few days a week, but has not since gyms have been closed. Enjoys gardening. She is currently ambulating with RW around her home, that she reports she is  able to do. Most pain with transitional movements STS, transferring in/out of the car, and negotiating stairs. Patient reports she has a 6in step up into the shower which is hard for her to use, but once she is in the shower she can stand and support herself to bathe.She has 2 steps to enter/exit her home without handrails. Reports 10/10 pain with transitional movements, 3/10 at best.    Limitations  Sitting;Lifting;Standing;Walking;House hold activities    How long can you sit comfortably?  takes a while to get comfortable, once comfortable can sit for an hour    How long can you stand comfortably?  42mins    How long can you walk comfortably?  couple minutes to bathroom and back    Diagnostic tests  Serial radiographs, no problems with components as reported by patient    Patient Stated Goals  Decrease pain to return to PLOF    Pain Onset  In the past 7 days              Manual - patella mobilization sup/inf and medial lateral 66mins with increased sensitivity initially.   Ther-Ex - Bike seat setting 12 forward and back 21mins for AAROM  - Ext prop stretch x81min hold; PT overpressure - Heel slide with overpressure 3x 30sec  hold - SLR with focus on quad activation 3x 10 with noted quad lag, but pt able to maintain good quad activation throughout - LAQ 3x 10 with good quad activation, min cuing  - Standing hip abd 2x 10 each YTB  with min cuing to prevent posterior hip translation  - Stanidng hip ext 2x 10 each YTB with min cuing for posture set up with good carry over following Review of mini squat with printout given of today's therex + mini squat to complete at home, and progress therex  Gait Training Gait training with [facility] for less restrictive device, with demo and cuing for proper technique with cane for walking. Gait training without AD with cuing for heel strike with knee ext and knee flex through swing phase with decent carry over  Flex  90 Ext-18d                        PT Education - 12/24/18 1051    Education provided  Yes    Education Details  therex, gait training    Person(s) Educated  Patient    Methods  Explanation;Demonstration;Tactile cues;Verbal cues    Comprehension  Verbalized understanding;Returned demonstration;Verbal cues required;Tactile cues required       PT Short Term Goals - 12/11/18 1118      PT SHORT TERM GOAL #1   Title  Pt will be independent with HEP in order to decrease knee pain and increase strength in order to improve pain-free function at home and work.    Time  4    Period  Weeks    Status  New        PT Long Term Goals - 12/11/18 1119      PT LONG TERM GOAL #1   Title  Patient will be able to complete 5 STS in 10seconds to demonstrate normal LE strength    Baseline  12/11/18 7 sec to complete 1 STS with 10/10 pain    Time  8    Period  Weeks    Status  New      PT LONG TERM GOAL #2   Title  Pt will decrease worst pain as reported on NPRS by at least 3 points in order to demonstrate clinically significant reduction in ankle/foot pain.    Baseline  12/11/18 10/10 pain with transfers    Time  8    Period  Weeks    Status  New      PT LONG TERM GOAL #3   Title  Pt will increase 10MWT to at least 1.4 m/s in order to demonstrate normal community ambulation, with normalized gait pattern w/o AD to return to PLOF    Baseline  12/11/18 0.5338m/s with step to pattern with RW    Time  8    Period  Weeks    Status  New      PT LONG TERM GOAL #4   Title  Patient will demonstrate normalized stair negotiation up 2 steps without handrail to safely enter and exit home    Baseline  12/11/18 Able to ambulate backward with RW and step to gait leading with RLE    Time  8    Period  Weeks    Status  New            Plan - 12/24/18 1251    Clinical Impression Statement  Pt is continuing to make good progress with tolerance and carry over of proper tehcnique  following PT cuing. PT led patient through gait training with SPC and without AD, which she is able to complete with good carry over of cuing and demonstration, with good confedience. PT will continue to progress ROM and strength with carry over into functional activities, including gait, as able.    Personal Factors and Comorbidities  Age;Comorbidity 1;Past/Current Experience;Sex    Comorbidities  OA, previous sx on L knee    Examination-Activity Limitations  Bathing;Squat;Lift;Stairs;Bed Mobility;Bend;Locomotion Level;Stand;Carry;Transfers;Sit;Sleep    Examination-Participation Restrictions  Cleaning;Laundry;Community Activity;Driving    Stability/Clinical Decision Making  Evolving/Moderate complexity    Clinical Decision Making  Moderate    Rehab Potential  Good    Clinical Impairments Affecting Rehab Potential  Positive: motivation, age, partial replacement, active; Negative: history of L knee problems    PT Frequency  2x / week    PT Duration  8 weeks    PT Treatment/Interventions  Aquatic Therapy;Electrical Stimulation;Cryotherapy;Iontophoresis 4mg /ml Dexamethasone;Moist Heat;DME Instruction;Functional mobility training;Therapeutic activities;Neuromuscular re-education;Dry needling;Passive range of motion;Joint Manipulations;Taping;Manual techniques;Therapeutic exercise;Gait training;Stair training;Patient/family education    PT Next Visit Plan  Mobility    PT Home Exercise Plan  Continue HEP quad sets, supine abd, heel slides, knee ext    Consulted and Agree with Plan of Care  Patient       Patient will benefit from skilled therapeutic intervention in order to improve the following deficits and impairments:  Abnormal gait, Decreased activity tolerance, Decreased coordination, Decreased strength, Increased fascial restricitons, Impaired flexibility, Pain, Postural dysfunction, Improper body mechanics, Decreased range of motion, Difficulty walking, Decreased endurance, Decreased mobility,  Increased muscle spasms  Visit Diagnosis: 1. Acute pain of left knee   2. Chronic pain of left knee   3. Muscle weakness (generalized)        Problem List Patient Active Problem List   Diagnosis Date Noted  . OA (osteoarthritis) of knee 12/08/2018  . Asthma, exercise induced 09/20/2014  . Overweight 09/20/2014   Staci Acostahelsea Miller PT, DPT Staci Acostahelsea Miller 12/24/2018, 3:01 PM  [facility] [address] [facility] [address] [place], [place], [address] Phone: [phone]   Fax:  [phone]  Name: Traci Drake MRN: [id number] Date of Birth: 1955/11/15

## 2018-12-26 ENCOUNTER — Ambulatory Visit: Admitting: Physical Therapy

## 2018-12-30 ENCOUNTER — Other Ambulatory Visit: Payer: Self-pay

## 2018-12-30 ENCOUNTER — Ambulatory Visit: Admitting: Physical Therapy

## 2018-12-30 ENCOUNTER — Encounter: Payer: Self-pay | Admitting: Physical Therapy

## 2018-12-30 DIAGNOSIS — M25562 Pain in left knee: Secondary | ICD-10-CM | POA: Diagnosis not present

## 2018-12-30 DIAGNOSIS — G8929 Other chronic pain: Secondary | ICD-10-CM

## 2018-12-30 DIAGNOSIS — M6281 Muscle weakness (generalized): Secondary | ICD-10-CM

## 2018-12-30 NOTE — Therapy (Signed)
[place] [facility] [address] [place], [address] Phone: [phone]   Fax:  [phone]  Physical Therapy Treatment  Patient Details  Name: Traci Drake MRN: [id number] Date of Birth: [birth year] Referring Provider (PT): Dr. Wynelle Beckmann   Encounter Date: 12/30/2018  PT End of Session - 12/30/18 1354    Visit Number  5    Number of Visits  17    Date for PT Re-Evaluation  02/05/19    PT Start Time  0145    PT Stop Time  0230    PT Time Calculation (min)  45 min    Activity Tolerance  Patient tolerated treatment well    Behavior During Therapy  [facility] for tasks assessed/performed       Past Medical History:  Diagnosis Date  . Asthma    seasonal  . Complication of anesthesia    needed albumin in PACU. and had vertigo  . Decreased libido without sexual dysfunction   . Menopause     Past Surgical History:  Procedure Laterality Date  . APPENDECTOMY  1989  . BREAST BIOPSY  2003  . KNEE ARTHROSCOPY Left    6-8 years ago  . 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  Subjective Assessment - 12/30/18 1352    Subjective  Patient reports some other muscle spasm pain Friday night. Reports other than that, she had a very good day yesterday, using her cane to get around. Diligence with HEP.    Pertinent History  patient is a 63 year old female, SP L TKA 12/08/18. 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 Patient had previously been working out a few days a week, but has not since gyms have been closed. Enjoys gardening. She is currently ambulating with RW around her home, that she  reports she is able to do. Most pain with transitional movements STS, transferring in/out of the car, and negotiating stairs. Patient reports she has a 6in step up into the shower which is hard for her to use, but once she is in the shower she can stand and support herself to bathe.She has 2 steps to enter/exit her home without handrails. Reports 10/10 pain with transitional movements, 3/10 at best.    Limitations  Sitting;Lifting;Standing;Walking;House hold activities    How long can you sit comfortably?  takes a while to get comfortable, once comfortable can sit for an hour    How long can you stand comfortably?  42mins    How long can you walk comfortably?  couple minutes to bathroom and back    Pain Onset  In the past 7 days       Ther-Ex - Bike seat setting 9 forward and back 53mins for AAROM  - Ext prop stretch x93min hold; PT overpressure - Heel slide with overpressure 3x 10sec hold - SLR with focus on quad activation 3x 10 with noted quad lag (approx 20d), but pt able to maintain good quad activation throughout - LAQ x10 1.5# AW 2x 10 with good quad activation, min cuing  - Standing hip abd 2x 10 each RTB with  min cuing to prevent lateral postural sway with good carry over following - Stanidng hip ext 2x 10 each RTB with min cuing for posture set up with good carry over following - Seated hamstring curl GTB 3x 10 with cuing initally for eccentric control, good carry over following  Gait Training Treadmill gait training 1.4mph over 4 mins with PT flexing knee through swing phase and ext at heel strike, encouraging patient to complete true heel strike. Completed 2 laps (25700ft) around gym with decent carry over of cuing, maintained inability for true heel strike with TKE, but able to improve this and verbalize understanding of technique needed   Flex100  Ext-6d                         PT Education - 12/30/18 1353    Education provided  Yes    Education Details   therex    [employer]) Educated  Patient    Methods  Explanation;Demonstration;Tactile cues;Verbal cues    Comprehension  Verbalized understanding;Returned demonstration;Verbal cues required;Tactile cues required       PT Short Term Goals - 12/11/18 1118      PT SHORT TERM GOAL #1   Title  Pt will be independent with HEP in order to decrease knee pain and increase strength in order to improve pain-free function at home and work.    Time  4    Period  Weeks    Status  New        PT Long Term Goals - 12/11/18 1119      PT LONG TERM GOAL #1   Title  Patient will be able to complete 5 STS in 10seconds to demonstrate normal LE strength    Baseline  12/11/18 7 sec to complete 1 STS with 10/10 pain    Time  8    Period  Weeks    Status  New      PT LONG TERM GOAL #2   Title  Pt will decrease worst pain as reported on NPRS by at least 3 points in order to demonstrate clinically significant reduction in ankle/foot pain.    Baseline  12/11/18 10/10 pain with transfers    Time  8    Period  Weeks    Status  New      PT LONG TERM GOAL #3   Title  Pt will increase 10MWT to at least 1.4 m/s in order to demonstrate normal community ambulation, with normalized gait pattern w/o AD to return to PLOF    Baseline  12/11/18 0.8145m/s with step to pattern with RW    Time  8    Period  Weeks    Status  New      PT LONG TERM GOAL #4   Title  Patient will demonstrate normalized stair negotiation up 2 steps without handrail to safely enter and exit home    Baseline  12/11/18 Able to ambulate backward with RW and step to gait leading with RLE    Time  8    Period  Weeks    Status  New            Plan - 12/30/18 1451    Clinical Impression Statement  PT continued therex progression for increased motion and strength with carry over into gait. pt is able to complete all therex with proper form following some cuing, and is able to take steps to normalizing gait pattern with better heel strike with  cuing. Lacks TKE needed for proper gait mechanics, verbalizes understanding on importance of knee ext ROM for this phase of gait. Will conitnue progression as able    Personal Factors and Comorbidities  Age;Comorbidity 1;Past/Current Experience;Sex    Comorbidities  OA, previous sx on L knee    Examination-Activity Limitations  Bathing;Squat;Lift;Stairs;Bed Mobility;Bend;Locomotion Level;Stand;Carry;Transfers;Sit;Sleep    Examination-Participation Restrictions  Cleaning;Laundry;Community Activity;Driving    Stability/Clinical Decision Making  Evolving/Moderate complexity    Rehab Potential  Good    Clinical Impairments Affecting Rehab Potential  Positive: motivation, age, partial replacement, active; Negative: history of L knee problems    PT Frequency  2x / week    PT Duration  8 weeks    PT Treatment/Interventions  Aquatic Therapy;Electrical Stimulation;Cryotherapy;Iontophoresis 4mg /ml Dexamethasone;Moist Heat;DME Instruction;Functional mobility training;Therapeutic activities;Neuromuscular re-education;Dry needling;Passive range of motion;Joint Manipulations;Taping;Manual techniques;Therapeutic exercise;Gait training;Stair training;Patient/family education    PT Next Visit Plan  Mobility    PT Home Exercise Plan  Continue HEP quad sets, supine abd, heel slides, knee ext    Consulted and Agree with Plan of Care  Patient       Patient will benefit from skilled therapeutic intervention in order to improve the following deficits and impairments:  Abnormal gait, Decreased activity tolerance, Decreased coordination, Decreased strength, Increased fascial restricitons, Impaired flexibility, Pain, Postural dysfunction, Improper body mechanics, Decreased range of motion, Difficulty walking, Decreased endurance, Decreased mobility, Increased muscle spasms  Visit Diagnosis: 1. Acute pain of left knee   2. Chronic pain of left knee   3. Muscle weakness (generalized)        Problem List Patient  Active Problem List   Diagnosis Date Noted  . OA (osteoarthritis) of knee 12/08/2018  . Asthma, exercise induced 09/20/2014  . Overweight 09/20/2014   Staci Acostahelsea Miller PT, DPT Staci Acostahelsea Miller 12/30/2018, 5:50 PM  [facility] [address] [place], [place], [address] Phone: [phone]   Fax:  [phone]  Name: Traci Drake MRN: [id number] Date of Birth: 1955-12-03

## 2019-01-01 ENCOUNTER — Encounter: Payer: Self-pay | Admitting: Physical Therapy

## 2019-01-01 ENCOUNTER — Ambulatory Visit: Admitting: Physical Therapy

## 2019-01-01 DIAGNOSIS — M25562 Pain in left knee: Secondary | ICD-10-CM | POA: Diagnosis not present

## 2019-01-01 DIAGNOSIS — G8929 Other chronic pain: Secondary | ICD-10-CM

## 2019-01-01 DIAGNOSIS — M6281 Muscle weakness (generalized): Secondary | ICD-10-CM

## 2019-01-01 NOTE — Therapy (Signed)
[facility] [address] [place], [place], [address] Phone: [phone]   Fax:  [phone]  Physical Therapy Treatment  Patient Details  Name: Traci KernCatherine H Rosetti MRN: [id number] Date of Birth: 31-May-1956 Referring Provider (PT): [name]   Encounter Date: 01/01/2019  PT End of Session - 01/01/19 1113    Visit Number  6    Number of Visits  17    Date for PT Re-Evaluation  02/05/19    PT Start Time  1039    PT Stop Time  1117    PT Time Calculation (min)  38 min    Activity Tolerance  Patient tolerated treatment well    Behavior During Therapy  [facility] for tasks assessed/performed       Past Medical History:  Diagnosis Date  . Asthma    seasonal  . Complication of anesthesia    needed albumin in PACU. and had vertigo  . Decreased libido without sexual dysfunction   . Menopause     Past Surgical History:  Procedure Laterality Date  . APPENDECTOMY  1989  . BREAST BIOPSY  2003  . KNEE ARTHROSCOPY Left    6-8 years ago  . 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  Subjective Assessment - 01/01/19 1049    Subjective  Patient reports she was very sore after last time and has had a lot of hamstring discomfort. Reports pain has come down to 4/10 today.    Pertinent History  patient is a 63 year old female, SP L TKA 12/08/18. 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 Patient had previously been working out a few days a week, but has not since gyms have been closed. Enjoys gardening. She is currently ambulating with RW around her home, that she reports she is able to do. Most pain  with transitional movements STS, transferring in/out of the car, and negotiating stairs. Patient reports she has a 6in step up into the shower which is hard for her to use, but once she is in the shower she can stand and support herself to bathe.She has 2 steps to enter/exit her home without handrails. Reports 10/10 pain with transitional movements, 3/10 at best.    Limitations  Sitting;Lifting;Standing;Walking;House hold activities    How long can you sit comfortably?  takes a while to get comfortable, once comfortable can sit for an hour    How long can you stand comfortably?  10mins    How long can you walk comfortably?  couple minutes to bathroom and back    Diagnostic tests  Serial radiographs, no problems with components as reported by patient           Ther-Ex - Bike seat setting 9 forward and back 5mins for AAROM - Wt shifting L and R to toe touch x20 - Standing SLS 10sec hold; on foam pad 2x 10sec hold - Lateral step down 50% of 4in step 3x 8 with demo and good carry over following; some cuing needed for max available knee ext - LAQ 1.5# AW 3x 10 with min cuing for full availible knee  ext   Manual STM with trigger point release to L hamstring with noted muscle spasms.  Following Dry Needling: (1) 50mm .30 needles placed along the L hamstring to decrease increased muscular spasms and trigger points with the patient positioned in supine. Patient was educated on risks and benefits of therapy and verbally consents to PT. Good local twitch response Passive ext in prone 62min progression, and flex 1min progression with TC at hamstrings that are spasming throughout Passive flex in sitting 4x 10sec hold  Flex 105d Ext 8d                        PT Education - 01/01/19 1112    Education provided  Yes    Education Details  therex    [employer]) Educated  Patient    Methods  Explanation;Demonstration;Tactile cues;Verbal cues    Comprehension  Verbalized  understanding;Returned demonstration;Verbal cues required;Tactile cues required       PT Short Term Goals - 12/11/18 1118      PT SHORT TERM GOAL #1   Title  Pt will be independent with HEP in order to decrease knee pain and increase strength in order to improve pain-free function at home and work.    Time  4    Period  Weeks    Status  New        PT Long Term Goals - 12/11/18 1119      PT LONG TERM GOAL #1   Title  Patient will be able to complete 5 STS in 10seconds to demonstrate normal LE strength    Baseline  12/11/18 7 sec to complete 1 STS with 10/10 pain    Time  8    Period  Weeks    Status  New      PT LONG TERM GOAL #2   Title  Pt will decrease worst pain as reported on NPRS by at least 3 points in order to demonstrate clinically significant reduction in ankle/foot pain.    Baseline  12/11/18 10/10 pain with transfers    Time  8    Period  Weeks    Status  New      PT LONG TERM GOAL #3   Title  Pt will increase 10MWT to at least 1.4 m/s in order to demonstrate normal community ambulation, with normalized gait pattern w/o AD to return to PLOF    Baseline  12/11/18 0.89m/s with step to pattern with RW    Time  8    Period  Weeks    Status  New      PT LONG TERM GOAL #4   Title  Patient will demonstrate normalized stair negotiation up 2 steps without handrail to safely enter and exit home    Baseline  12/11/18 Able to ambulate backward with RW and step to gait leading with RLE    Time  8    Period  Weeks    Status  New            Plan - 01/01/19 1141    Clinical Impression Statement  PT utilized manual techniques with TDN to decrease hamstring muscle spasm and tension witht good response, and increased passive extension following. PT continued therex progression with less hamstring activation, to gauge pain response following. Patinet is able to complete all therex, with increased proprioception and proper form following some cuing for PT. PT will progress  therex to restore ROM and strength needed for PLOF, and  ADLs as able.    Personal Factors and Comorbidities  Age;Comorbidity 1;Past/Current Experience;Sex    Comorbidities  OA, previous sx on L knee    Examination-Activity Limitations  Bathing;Squat;Lift;Stairs;Bed Mobility;Bend;Locomotion Level;Stand;Carry;Transfers;Sit;Sleep    Stability/Clinical Decision Making  Evolving/Moderate complexity    Clinical Decision Making  Moderate    Rehab Potential  Good    PT Duration  8 weeks    PT Treatment/Interventions  Aquatic Therapy;Electrical Stimulation;Cryotherapy;Iontophoresis 4mg /ml Dexamethasone;Moist Heat;DME Instruction;Functional mobility training;Therapeutic activities;Neuromuscular re-education;Dry needling;Passive range of motion;Joint Manipulations;Taping;Manual techniques;Therapeutic exercise;Gait training;Stair training;Patient/family education    PT Next Visit Plan  Mobility    PT Home Exercise Plan  Continue HEP quad sets, supine abd, heel slides, knee ext    Consulted and Agree with Plan of Care  Patient       Patient will benefit from skilled therapeutic intervention in order to improve the following deficits and impairments:  Abnormal gait, Decreased activity tolerance, Decreased coordination, Decreased strength, Increased fascial restricitons, Impaired flexibility, Pain, Postural dysfunction, Improper body mechanics, Decreased range of motion, Difficulty walking, Decreased endurance, Decreased mobility, Increased muscle spasms  Visit Diagnosis: 1. Acute pain of left knee   2. Chronic pain of left knee   3. Muscle weakness (generalized)        Problem List Patient Active Problem List   Diagnosis Date Noted  . OA (osteoarthritis) of knee 12/08/2018  . Asthma, exercise induced 09/20/2014  . Overweight 09/20/2014   Staci Acostahelsea Miller PT, DPT Staci Acostahelsea Miller 01/01/2019, 11:44 AM  [facility] [address] [place], [place], [address] Phone: [phone]   Fax:  [phone]  Name: Traci KernCatherine H Drake MRN: [id number] Date of Birth: [birth year]

## 2019-01-02 ENCOUNTER — Encounter: Admitting: Physical Therapy

## 2019-01-05 ENCOUNTER — Encounter: Payer: Self-pay | Admitting: Physical Therapy

## 2019-01-05 ENCOUNTER — Other Ambulatory Visit: Payer: Self-pay

## 2019-01-05 ENCOUNTER — Ambulatory Visit: Admitting: Physical Therapy

## 2019-01-05 DIAGNOSIS — M25562 Pain in left knee: Secondary | ICD-10-CM | POA: Diagnosis not present

## 2019-01-05 NOTE — Therapy (Signed)
[facility] [address], [place], [address] Phone: [phone]   Fax:  [phone]  Physical Therapy Treatment  Patient Details  Name: Traci Drake MRN: [id number] Date of Birth: 24-Aug-1955 Referring Provider (PT): Dr. Wynelle Beckmann   Encounter Date: 01/05/2019  PT End of Session - 01/05/19 1203    Visit Number  7    Number of Visits  17    Date for PT Re-Evaluation  02/05/19    PT Start Time  1130    PT Stop Time  1215    PT Time Calculation (min)  45 min    Activity Tolerance  Patient tolerated treatment well    Behavior During Therapy  [facility] for tasks assessed/performed       Past Medical History:  Diagnosis Date  . Asthma    seasonal  . Complication of anesthesia    needed albumin in PACU. and had vertigo  . Decreased libido without sexual dysfunction   . Menopause     Past Surgical History:  Procedure Laterality Date  . APPENDECTOMY  1989  . BREAST BIOPSY  2003  . KNEE ARTHROSCOPY Left    6-8 years ago  . 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  Subjective Assessment - 01/05/19 1131    Subjective  Reports she did have hamstring soreness 24 hours following, but that her motion was better. Reports she has been walking to and from the corner without her cane a few times a day, focusing on heel strike, which she thinks has been helfpul. 3/10 pain today    Pertinent History  patient is a 63 year old female, SP L TKA 12/08/18. 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 Patient had previously been working out a few days a week, but has not since gyms have been  closed. Enjoys gardening. She is currently ambulating with RW around her home, that she reports she is able to do. Most pain with transitional movements STS, transferring in/out of the car, and negotiating stairs. Patient reports she has a 6in step up into the shower which is hard for her to use, but once she is in the shower she can stand and support herself to bathe.She has 2 steps to enter/exit her home without handrails. Reports 10/10 pain with transitional movements, 3/10 at best.    How long can you sit comfortably?  takes a while to get comfortable, once comfortable can sit for an hour    How long can you stand comfortably?  73mins    How long can you walk comfortably?  couple minutes to bathroom and back    Diagnostic tests  Serial radiographs, no problems with components as reported by patient    Patient Stated Goals  Decrease pain to return to PLOF         Ther-Ex - Bike seat setting24forward and back 34min before able to complete full rotations 72min; setting 8 forward and back 18min before full rotations 63min for AAROM - LAQ 2.5# AW 3x 10 with min cuing for full availible knee ext - Semi circle sliders with standing  leg in slight bend 3x 10 each side with demo and cuing initially for proper form - Mini squat with mat table UE support x10 good form; with RLE on balance stone 2x 8 with good carry over of proper technique with increased RLE WB - Lateral step down 4in step 3x 8 with patient able to tap L heel to floor with some cuing needed for proper form, and posture throughout   Manual STM with trigger point release to L hamstring with noted muscle spasms.  Elys flexion 5x 10sec hold, Elys progressive ext with TC at hamstrings over 4mins  Gait Training:  Ambulation 3200ft around clinic without AD with patient demonstrating good focus on heel strike with better toe push off following cuing  Flex 110d Ext 5d                       PT Education - 01/05/19 1150     Education provided  Yes    Education Details  therex form    Person(s) Educated  Patient    Methods  Explanation;Demonstration;Tactile cues;Verbal cues    Comprehension  Verbalized understanding;Returned demonstration;Verbal cues required;Tactile cues required       PT Short Term Goals - 12/11/18 1118      PT SHORT TERM GOAL #1   Title  Pt will be independent with HEP in order to decrease knee pain and increase strength in order to improve pain-free function at home and work.    Time  4    Period  Weeks    Status  New        PT Long Term Goals - 12/11/18 1119      PT LONG TERM GOAL #1   Title  Patient will be able to complete 5 STS in 10seconds to demonstrate normal LE strength    Baseline  12/11/18 7 sec to complete 1 STS with 10/10 pain    Time  8    Period  Weeks    Status  New      PT LONG TERM GOAL #2   Title  Pt will decrease worst pain as reported on NPRS by at least 3 points in order to demonstrate clinically significant reduction in ankle/foot pain.    Baseline  12/11/18 10/10 pain with transfers    Time  8    Period  Weeks    Status  New      PT LONG TERM GOAL #3   Title  Pt will increase 10MWT to at least 1.4 m/s in order to demonstrate normal community ambulation, with normalized gait pattern w/o AD to return to PLOF    Baseline  12/11/18 0.7974m/s with step to pattern with RW    Time  8    Period  Weeks    Status  New      PT LONG TERM GOAL #4   Title  Patient will demonstrate normalized stair negotiation up 2 steps without handrail to safely enter and exit home    Baseline  12/11/18 Able to ambulate backward with RW and step to gait leading with RLE    Time  8    Period  Weeks    Status  New            Plan - 01/05/19 1217    Clinical Impression Statement  PT continued to increase therex intensity, with functional movement, and attempted to increase ROM, which pt is able to complete with cuing needed for accuracy.  Patient is having some hamstring  muscle spasms, increasing motion restriction. PT attempted to relieve this with some success, suggested heat application to prevent gaurding/spasmPT wil continue progression as able to restore strength and motion for optimal return to PLOF    Personal Factors and Comorbidities  Age;Comorbidity 1;Past/Current Experience;Sex    Comorbidities  OA, previous sx on L knee    Examination-Activity Limitations  Bathing;Squat;Lift;Stairs;Bed Mobility;Bend;Locomotion Level;Stand;Carry;Transfers;Sit;Sleep    Examination-Participation Restrictions  Cleaning;Laundry;Community Activity;Driving    Stability/Clinical Decision Making  Evolving/Moderate complexity    Clinical Decision Making  Moderate    Rehab Potential  Good    Clinical Impairments Affecting Rehab Potential  Positive: motivation, age, partial replacement, active; Negative: history of L knee problems    PT Frequency  2x / week    PT Duration  8 weeks    PT Treatment/Interventions  Aquatic Therapy;Electrical Stimulation;Cryotherapy;Iontophoresis 4mg /ml Dexamethasone;Moist Heat;DME Instruction;Functional mobility training;Therapeutic activities;Neuromuscular re-education;Dry needling;Passive range of motion;Joint Manipulations;Taping;Manual techniques;Therapeutic exercise;Gait training;Stair training;Patient/family education    PT Next Visit Plan  Mobility    PT Home Exercise Plan  Continue HEP quad sets, supine abd, heel slides, knee ext    Consulted and Agree with Plan of Care  Patient       Patient will benefit from skilled therapeutic intervention in order to improve the following deficits and impairments:  Abnormal gait, Decreased activity tolerance, Decreased coordination, Decreased strength, Increased fascial restricitons, Impaired flexibility, Pain, Postural dysfunction, Improper body mechanics, Decreased range of motion, Difficulty walking, Decreased endurance, Decreased mobility, Increased muscle spasms  Visit Diagnosis: 1. Acute pain of  left knee        Problem List Patient Active Problem List   Diagnosis Date Noted  . OA (osteoarthritis) of knee 12/08/2018  . Asthma, exercise induced 09/20/2014  . Overweight 09/20/2014   Staci Acostahelsea Miller PT, DPT Staci Acostahelsea Miller 01/05/2019, 12:23 PM  [facility] [address] [place], [place], [address] Phone: [phone]   Fax:  [phone]  Name: Stevie KernCatherine H Drake MRN: [id number] Date of Birth: 08/04/1955

## 2019-01-06 ENCOUNTER — Encounter: Admitting: Physical Therapy

## 2019-01-07 ENCOUNTER — Encounter: Payer: Self-pay | Admitting: Physical Therapy

## 2019-01-07 ENCOUNTER — Other Ambulatory Visit: Payer: Self-pay

## 2019-01-07 ENCOUNTER — Ambulatory Visit: Admitting: Physical Therapy

## 2019-01-07 DIAGNOSIS — M25562 Pain in left knee: Secondary | ICD-10-CM | POA: Diagnosis not present

## 2019-01-07 DIAGNOSIS — M6281 Muscle weakness (generalized): Secondary | ICD-10-CM

## 2019-01-07 NOTE — Therapy (Signed)
[facility] [address] [place], [place], [address] Phone: [phone]   Fax:  [phone]  Physical Therapy Treatment  Patient Details  Name: Stevie KernCatherine H Granados MRN: [id number] Date of Birth: 1955-07-19 Referring Provider (PT): [name]   Encounter Date: 01/07/2019  PT End of Session - 01/07/19 1340    Visit Number  8    Number of Visits  17    Date for PT Re-Evaluation  02/05/19    PT Start Time  0100    PT Stop Time  0145    PT Time Calculation (min)  45 min    Activity Tolerance  Patient tolerated treatment well    Behavior During Therapy  [facility] for tasks assessed/performed       Past Medical History:  Diagnosis Date  . Asthma    seasonal  . Complication of anesthesia    needed albumin in PACU. and had vertigo  . Decreased libido without sexual dysfunction   . Menopause     Past Surgical History:  Procedure Laterality Date  . APPENDECTOMY  1989  . BREAST BIOPSY  2003  . KNEE ARTHROSCOPY Left    6-8 years ago  . 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  Subjective Assessment - 01/07/19 1305    Subjective  Reports she did a lot of walking yesterday, shopping with her daughter, with increased hamstring and gastroc tension following. Reports she has had a lot of cramping in this areas, reports 5/10 pain today.    Pertinent History  patient is a 63 year old female, SP L TKA 12/08/18. 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 Patient had previously been working out a few days a week, but has not since gyms have been closed. Enjoys gardening. She is currently ambulating  with RW around her home, that she reports she is able to do. Most pain with transitional movements STS, transferring in/out of the car, and negotiating stairs. Patient reports she has a 6in step up into the shower which is hard for her to use, but once she is in the shower she can stand and support herself to bathe.She has 2 steps to enter/exit her home without handrails. Reports 10/10 pain with transitional movements, 3/10 at best.    Limitations  Sitting;Lifting;Standing;Walking;House hold activities    How long can you sit comfortably?  takes a while to get comfortable, once comfortable can sit for an hour    How long can you stand comfortably?  10mins    How long can you walk comfortably?  couple minutes to bathroom and back    Diagnostic tests  Serial radiographs, no problems with components as reported by patient    Patient Stated Goals  Decrease pain to return to PLOF    Pain Onset  In the past 7 days           Ther-Ex - Bike seat setting299forward and back setting 8 forward and back 1min before full rotations 2min for AAROM - SLR x10 with quad lag 8d - Knee ext 5# 3x 5 with RLE assisting as needed, increased  pain initially with some  - OMEGA leg press 5# x5; 10# 2x 5 min cuing for eccentric control with good carry over following.  - MATRIX hip abd 10# x8 each side ; 25# 2x 8 each side; cuing initially for proper posture with good carry over following   Manual STM withtrigger point releaseto L hamstring and gastroc with noted muscle spasms.  Elys flexion 5x 10sec hold, Elys progressive ext with TC at hamstrings over 6mins  Flex 112d Ext 8d                     PT Education - 01/07/19 1333    Education provided  Yes    Education Details  therex form    Person(s) Educated  Patient    Methods  Explanation;Demonstration;Tactile cues;Verbal cues    Comprehension  Verbalized understanding;Returned demonstration;Verbal cues required;Tactile cues required        PT Short Term Goals - 12/11/18 1118      PT SHORT TERM GOAL #1   Title  Pt will be independent with HEP in order to decrease knee pain and increase strength in order to improve pain-free function at home and work.    Time  4    Period  Weeks    Status  New        PT Long Term Goals - 12/11/18 1119      PT LONG TERM GOAL #1   Title  Patient will be able to complete 5 STS in 10seconds to demonstrate normal LE strength    Baseline  12/11/18 7 sec to complete 1 STS with 10/10 pain    Time  8    Period  Weeks    Status  New      PT LONG TERM GOAL #2   Title  Pt will decrease worst pain as reported on NPRS by at least 3 points in order to demonstrate clinically significant reduction in ankle/foot pain.    Baseline  12/11/18 10/10 pain with transfers    Time  8    Period  Weeks    Status  New      PT LONG TERM GOAL #3   Title  Pt will increase 10MWT to at least 1.4 m/s in order to demonstrate normal community ambulation, with normalized gait pattern w/o AD to return to PLOF    Baseline  12/11/18 0.5057m/s with step to pattern with RW    Time  8    Period  Weeks    Status  New      PT LONG TERM GOAL #4   Title  Patient will demonstrate normalized stair negotiation up 2 steps without handrail to safely enter and exit home    Baseline  12/11/18 Able to ambulate backward with RW and step to gait leading with RLE    Time  8    Period  Weeks    Status  New            Plan - 01/07/19 1352    Clinical Impression Statement  Patient with som increased ROM restrictions this date d/t increased muscle tension in L gastroc and hamstring. Patient continues to respond well to manual techniques for this, with increase time needed for manual techniques to resolve motion restrictions. PT utilized machines for increased resistance for strengthening, with patient able to complete all therex with some cuing needed for proper form.PT will continue progression as able [facility]     Personal  Factors and Comorbidities  Age;Comorbidity 1;Past/Current Experience;Sex    Comorbidities  OA, previous sx on L knee    Examination-Activity Limitations  Bathing;Squat;Lift;Stairs;Bed Mobility;Bend;Locomotion Level;Stand;Carry;Transfers;Sit;Sleep    Stability/Clinical Decision Making  Evolving/Moderate complexity    Clinical Decision Making  Moderate    Rehab Potential  Good    Clinical Impairments Affecting Rehab Potential  Positive: motivation, age, partial replacement, active; Negative: history of L knee problems    PT Frequency  2x / week    PT Duration  8 weeks    PT Treatment/Interventions  Aquatic Therapy;Electrical Stimulation;Cryotherapy;Iontophoresis 4mg /ml Dexamethasone;Moist Heat;DME Instruction;Functional mobility training;Therapeutic activities;Neuromuscular re-education;Dry needling;Passive range of motion;Joint Manipulations;Taping;Manual techniques;Therapeutic exercise;Gait training;Stair training;Patient/family education    PT Next Visit Plan  Mobility    PT Home Exercise Plan  Continue HEP quad sets, supine abd, heel slides, knee ext    Consulted and Agree with Plan of Care  Patient       Patient will benefit from skilled therapeutic intervention in order to improve the following deficits and impairments:  Abnormal gait, Decreased activity tolerance, Decreased coordination, Decreased strength, Increased fascial restricitons, Impaired flexibility, Pain, Postural dysfunction, Improper body mechanics, Decreased range of motion, Difficulty walking, Decreased endurance, Decreased mobility, Increased muscle spasms  Visit Diagnosis: 1. Acute pain of left knee   2. Muscle weakness (generalized)        Problem List Patient Active Problem List   Diagnosis Date Noted  . OA (osteoarthritis) of knee 12/08/2018  . Asthma, exercise induced 09/20/2014  . Overweight 09/20/2014   Shelton Silvas PT, DPT Shelton Silvas 01/07/2019, 1:56 PM  [facility] [address], [place], [address] Phone: [phone]   Fax:  [phone]  Name: KATHRYNN BACKSTROM MRN: [id number] Date of Birth: 08/13/1955

## 2019-01-09 ENCOUNTER — Encounter: Admitting: Physical Therapy

## 2019-01-13 ENCOUNTER — Other Ambulatory Visit: Payer: Self-pay

## 2019-01-13 ENCOUNTER — Ambulatory Visit

## 2019-01-13 ENCOUNTER — Ambulatory Visit: Admitting: Physical Therapy

## 2019-01-13 DIAGNOSIS — M6281 Muscle weakness (generalized): Secondary | ICD-10-CM

## 2019-01-13 DIAGNOSIS — M25562 Pain in left knee: Secondary | ICD-10-CM | POA: Diagnosis not present

## 2019-01-13 DIAGNOSIS — M25561 Pain in right knee: Secondary | ICD-10-CM

## 2019-01-13 DIAGNOSIS — G8929 Other chronic pain: Secondary | ICD-10-CM

## 2019-01-13 NOTE — Therapy (Signed)
[facility] [address], [place], [address] Phone: [phone]   Fax:  [phone]  Physical Therapy Treatment  Patient Details  Name: Traci Drake MRN: [id number] Date of Birth: 1955-12-18 Referring Provider (PT): Dr. Wynelle Beckmann   Encounter Date: 01/13/2019  PT End of Session - 01/13/19 0912    Visit Number  9    Number of Visits  17    Date for PT Re-Evaluation  02/05/19    PT Start Time  0904    PT Stop Time  0944    PT Time Calculation (min)  40 min    Activity Tolerance  Patient tolerated treatment well;No increased pain    Behavior During Therapy  WFL for tasks assessed/performed       Past Medical History:  Diagnosis Date  . Asthma    seasonal  . Complication of anesthesia    needed albumin in PACU. and had vertigo  . Decreased libido without sexual dysfunction   . Menopause     Past Surgical History:  Procedure Laterality Date  . APPENDECTOMY  1989  . BREAST BIOPSY  2003  . KNEE ARTHROSCOPY Left    6-8 years ago  . 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  Subjective Assessment - 01/13/19 0907    Subjective  Pt reporting her biggest issue remains lots of continued spasm issues on the left leg during the night, intermittent and constant twitching. It all remains very sore thereafter.    Pertinent History  patient is a 63 year old female, SP L TKA 12/08/18. 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 Patient had previously been working out a few days a week, but has not since gyms have been closed. Enjoys gardening. She is currently ambulating with RW  around her home, that she reports she is able to do. Most pain with transitional movements STS, transferring in/out of the car, and negotiating stairs. Patient reports she has a 6in step up into the shower which is hard for her to use, but once she is in the shower she can stand and support herself to bathe.She has 2 steps to enter/exit her home without handrails. Reports 10/10 pain with transitional movements, 3/10 at best.    Currently in Pain?  Yes    Pain Score  3     Pain Location  Knee    Pain Orientation  Left    Pain Descriptors / Indicators  --   back of knee   Pain Type  Surgical pain         [facility] PT Assessment - 01/13/19 0001      ROM / Strength   AROM / PROM / Strength  PROM      PROM   PROM Assessment Site  Knee    Right/Left Knee  Left    Left Knee Extension  15    Left Knee Flexion  115      Flexibility   Soft Tissue Assessment /Muscle Length  yes    Quadriceps  --   apparent dystonia in much of vastus lateralis on Left;  INTERVENTION THIS DATE:    Ther-Ex -Bike seat setting53frward and back; seat 7 forward and back 167m before full rotations 82m35mor AAROM -prone Left knee Extesntion stretch 3x60sec -prone Left knee Flexion 3x30sec- -SLR x10 with quad lag 8d - Knee ext 5# 3x 5 with RLE assisting as needed, increased pain initially with some  -Knee distraction mobilization in prone end range extension 2x30sec (favorable to patient to address stiffness) -tibial inferior glide at 90 degrees flexion in prone 2x30sec (favorable to patient to address stiffness) -Supine Left SAQ 2x10, no weight this date  -Gentle MFR to Left vastus lateralis between sets while resting.  - MATRIX hip abd 10# x8 each side ; 25# 2x 8 each side; cuing initially for proper posture with good carry over following                      PT Short Term Goals - 01/13/19 0953      PT SHORT TERM GOAL #1   Title  Pt will be independent with HEP in order to decrease  knee pain and increase strength in order to improve pain-free function at home and work.    Time  4    Period  Weeks    Status  Partially Met        PT Long Term Goals - 12/11/18 1119      PT LONG TERM GOAL #1   Title  Patient will be able to complete 5 STS in 10seconds to demonstrate normal LE strength    Baseline  12/11/18 7 sec to complete 1 STS with 10/10 pain    Time  8    Period  Weeks    Status  New      PT LONG TERM GOAL #2   Title  Pt will decrease worst pain as reported on NPRS by at least 3 points in order to demonstrate clinically significant reduction in ankle/foot pain.    Baseline  12/11/18 10/10 pain with transfers    Time  8    Period  Weeks    Status  New      PT LONG TERM GOAL #3   Title  Pt will increase 10MWT to at least 1.4 m/s in order to demonstrate normal community ambulation, with normalized gait pattern w/o AD to return to PLOF    Baseline  12/11/18 0.32m41mith step to pattern with RW    Time  8    Period  Weeks    Status  New      PT LONG TERM GOAL #4   Title  Patient will demonstrate normalized stair negotiation up 2 steps without handrail to safely enter and exit home    Baseline  12/11/18 Able to ambulate backward with RW and step to gait leading with RLE    Time  8    Period  Weeks    Status  New            Plan - 01/13/19 0913    Clinical Impression Statement  In general, patient demonstrating fair tolerance to therapy session this date, reasonable accommodations are alllowed in-session to allow adequate rest between activities as needed. Pt has persistent aggravaton and pain at end range for the last few days. ROM in knee this date 16-115 degrees. Pt seems to respond favorably to gentle jointmobilization at end range focused at improving capsular restrictions to ROM at end-range. Pt demonstrates focused motivation to fully participate in therapy to the best of  ability. No attempt to progress strength this date due to acutely aggravated  symptoms. Knee extensor lag remains obvious in supine, prone, and AMB, the pt struggling to combat guarding this date. Pt given intermittent multimodal cues to teach best possible form with exercises. Pt continues to make steady progress toward most goals. No home exercise updates made at this time.    Stability/Clinical Decision Making  Evolving/Moderate complexity    Rehab Potential  Good    Clinical Impairments Affecting Rehab Potential  Positive: motivation, age, partial replacement, active; Negative: history of L knee problems    PT Frequency  2x / week    PT Duration  8 weeks    PT Treatment/Interventions  Aquatic Therapy;Electrical Stimulation;Cryotherapy;Iontophoresis 20m/ml Dexamethasone;Moist Heat;DME Instruction;Functional mobility training;Therapeutic activities;Neuromuscular re-education;Dry needling;Passive range of motion;Joint Manipulations;Taping;Manual techniques;Therapeutic exercise;Gait training;Stair training;Patient/family education    PT Next Visit Plan  Continue to work on mobility    PT Home Exercise Plan  Continue HEP quad sets, supine abd, heel slides, knee ext    Consulted and Agree with Plan of Care  Patient       Patient will benefit from skilled therapeutic intervention in order to improve the following deficits and impairments:  Abnormal gait, Decreased activity tolerance, Decreased coordination, Decreased strength, Increased fascial restricitons, Impaired flexibility, Pain, Postural dysfunction, Improper body mechanics, Decreased range of motion, Difficulty walking, Decreased endurance, Decreased mobility, Increased muscle spasms  Visit Diagnosis: 1. Acute pain of left knee   2. Muscle weakness (generalized)   3. Chronic pain of left knee   4. Chronic pain of right knee        Problem List Patient Active Problem List   Diagnosis Date Noted  . OA (osteoarthritis) of knee 12/08/2018  . Asthma, exercise induced 09/20/2014  . Overweight 09/20/2014   9:56  AM, 01/13/19 AEtta Grandchild PT, DPT Physical Therapist - [phone](Office)    Zuleika Gallus C 01/13/2019, 9:56 AM  [facility] [address] [place] [address]Phone: [phone]  Fax:  [phone] Name: Traci ARMELMRN: [id number] of Birth: [birth year]

## 2019-01-16 ENCOUNTER — Other Ambulatory Visit: Payer: Self-pay

## 2019-01-16 ENCOUNTER — Ambulatory Visit: Admitting: Physical Therapy

## 2019-01-16 ENCOUNTER — Encounter: Payer: Self-pay | Admitting: Physical Therapy

## 2019-01-16 DIAGNOSIS — G8929 Other chronic pain: Secondary | ICD-10-CM

## 2019-01-16 DIAGNOSIS — M6281 Muscle weakness (generalized): Secondary | ICD-10-CM

## 2019-01-16 DIAGNOSIS — M25562 Pain in left knee: Secondary | ICD-10-CM

## 2019-01-16 DIAGNOSIS — M25561 Pain in right knee: Secondary | ICD-10-CM

## 2019-01-16 NOTE — Therapy (Signed)
[facility] [address], [place], [address] Phone: [phone]   Fax:  [phone]  Physical Therapy Treatment  Patient Details  Name: Traci Drake MRN: [id number] Date of Birth: August 19, 1955 Referring Provider (PT): Dr. Wynelle Beckmann   Encounter Date: 01/16/2019  PT End of Session - 01/16/19 0850    Visit Number  10    Number of Visits  17    Date for PT Re-Evaluation  02/05/19    PT Start Time  0830    PT Stop Time  0915    PT Time Calculation (min)  45 min    Equipment Utilized During Treatment  Gait belt    Activity Tolerance  Patient tolerated treatment well;No increased pain    Behavior During Therapy  WFL for tasks assessed/performed       Past Medical History:  Diagnosis Date  . Asthma    seasonal  . Complication of anesthesia    needed albumin in PACU. and had vertigo  . Decreased libido without sexual dysfunction   . Menopause     Past Surgical History:  Procedure Laterality Date  . APPENDECTOMY  1989  . BREAST BIOPSY  2003  . KNEE ARTHROSCOPY Left    6-8 years ago  . 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  Subjective Assessment - 01/16/19 0832    Subjective  Patient reports she felt better folllowing last session with some "tension relief". Reports 4/10 pain today.    Pertinent History  patient is a 63 year old female, SP L TKA 12/08/18. 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 Patient had previously been working out a few days a week, but has not since gyms have been closed. Enjoys gardening. She is currently ambulating with RW around her home, that  she reports she is able to do. Most pain with transitional movements STS, transferring in/out of the car, and negotiating stairs. Patient reports she has a 6in step up into the shower which is hard for her to use, but once she is in the shower she can stand and support herself to bathe.She has 2 steps to enter/exit her home without handrails. Reports 10/10 pain with transitional movements, 3/10 at best.    Limitations  Sitting;Lifting;Standing;Walking;House hold activities    How long can you sit comfortably?  takes a while to get comfortable, once comfortable can sit for an hour    How long can you stand comfortably?  39mns    How long can you walk comfortably?  couple minutes to bathroom and back    Diagnostic tests  Serial radiographs, no problems with components as reported by patient    Patient Stated Goals  Decrease pain to return to PLOF    Pain Onset  In the past 7 days       Ther-Ex -Bike seat setting731fl rotations; seat 6 full rotationfor AAROM -prone Left knee Extesntion stretch 3x60sec -prone Left knee Flexion 3x30sec -Knee distraction mobilization in prone end range extension 2x30sec (favorable to patient to address stiffness) -tibial inferior glide at 90 degrees flexion in prone 2x30sec (favorable to  patient to address stiffness) - OMEGA leg 10# x10; 15# 2x 8 with good carry over of eccentric control - Lateral step down 4in step with minimal UE support 3x 10 with min cuing to prevent L hip drop with good carry over following - TRX SL squat with R toe down 3x 6/7/8 with cuing for terminal hip and knee ext - SL on foam pad 30sec; with alt pulp to nose 2x 10 each                          PT Education - 01/16/19 0834    Education provided  Yes    Education Details  therex form    Person(s) Educated  Patient    Methods  Explanation;Demonstration;Tactile cues;Verbal cues    Comprehension  Verbalized understanding;Returned demonstration;Verbal cues  required       PT Short Term Goals - 01/13/19 0953      PT SHORT TERM GOAL #1   Title  Pt will be independent with HEP in order to decrease knee pain and increase strength in order to improve pain-free function at home and work.    Time  4    Period  Weeks    Status  Partially Met        PT Long Term Goals - 01/13/19 0953      PT LONG TERM GOAL #1   Title  Patient will be able to complete 5 STS in 10seconds to demonstrate normal LE strength    Baseline  12/11/18 7 sec to complete 1 STS with 10/10 pain;   pt acutely exacerbated this date, will assess at next visit to obtain accurate appraisal.   Time  8    Period  Weeks    Status  On-going      PT LONG TERM GOAL #2   Title  Pt will decrease worst pain as reported on NPRS by at least 3 points in order to demonstrate clinically significant reduction in ankle/foot pain.    Baseline  12/11/18 10/10 pain with transfers; 2-3/10 this date, generally feeling well during the daytime    Time  8    Period  Weeks    Status  Achieved      PT LONG TERM GOAL #3   Title  Pt will increase 10MWT to at least 1.4 m/s in order to demonstrate normal community ambulation, with normalized gait pattern w/o AD to return to PLOF    Baseline  12/11/18 0.54ms with step to pattern with RW; AMB has progressed but remains slow, painful,and limited secondary to extension lag    Time  8    Period  Weeks    Status  On-going      PT LONG TERM GOAL #4   Title  Patient will demonstrate normalized stair negotiation up 2 steps without handrail to safely enter and exit home    Baseline  12/11/18 Able to ambulate backward with RW and step to gait leading with RLE    Time  8    Period  Weeks    Status  On-going            Plan - 01/16/19 05176   Clinical Impression Statement  PT continued progression for increased ROM and strength with good success. Pt able to complete all therex with proper form with some cuing needed for proper muscle recruitment and  technique. Patient motion 8-118 to date. PT will continue progression as able.  Personal Factors and Comorbidities  Age;Comorbidity 1;Past/Current Experience;Sex    Examination-Activity Limitations  Bathing;Squat;Lift;Stairs;Bed Mobility;Bend;Locomotion Level;Stand;Carry;Transfers;Sit;Sleep    Examination-Participation Restrictions  Cleaning;Laundry;Community Activity;Driving    Stability/Clinical Decision Making  Evolving/Moderate complexity    Clinical Decision Making  Moderate    Rehab Potential  Good    Clinical Impairments Affecting Rehab Potential  Positive: motivation, age, partial replacement, active; Negative: history of L knee problems    PT Frequency  2x / week    PT Duration  8 weeks    PT Treatment/Interventions  Aquatic Therapy;Electrical Stimulation;Cryotherapy;Iontophoresis 100m/ml Dexamethasone;Moist Heat;DME Instruction;Functional mobility training;Therapeutic activities;Neuromuscular re-education;Dry needling;Passive range of motion;Joint Manipulations;Taping;Manual techniques;Therapeutic exercise;Gait training;Stair training;Patient/family education    PT Next Visit Plan  Continue to work on mobility    PT Home Exercise Plan  Continue HEP quad sets, supine abd, heel slides, knee ext    Consulted and Agree with Plan of Care  Patient       Patient will benefit from skilled therapeutic intervention in order to improve the following deficits and impairments:  Abnormal gait, Decreased activity tolerance, Decreased coordination, Decreased strength, Increased fascial restricitons, Impaired flexibility, Pain, Postural dysfunction, Improper body mechanics, Decreased range of motion, Difficulty walking, Decreased endurance, Decreased mobility, Increased muscle spasms  Visit Diagnosis: 1. Acute pain of left knee   2. Muscle weakness (generalized)   3. Chronic pain of left knee   4. Chronic pain of right knee        Problem List Patient Active Problem List   Diagnosis Date  Noted  . OA (osteoarthritis) of knee 12/08/2018  . Asthma, exercise induced 09/20/2014  . Overweight 09/20/2014   CShelton SilvasPT, DPT CShelton Silvas7/31/2020, 9:40 AM  [facility] [address] [place] [address]Phone: [phone]  Fax:  [phone] Name: CDACI STUBBEMRN: [id number] of Birth: [birth year]

## 2019-01-19 ENCOUNTER — Ambulatory Visit: Admitting: Physical Therapy

## 2019-01-21 ENCOUNTER — Encounter: Payer: Self-pay | Admitting: Physical Therapy

## 2019-01-21 ENCOUNTER — Ambulatory Visit: Attending: Student | Admitting: Physical Therapy

## 2019-01-21 ENCOUNTER — Other Ambulatory Visit: Payer: Self-pay

## 2019-01-21 DIAGNOSIS — G8929 Other chronic pain: Secondary | ICD-10-CM | POA: Insufficient documentation

## 2019-01-21 DIAGNOSIS — M6281 Muscle weakness (generalized): Secondary | ICD-10-CM | POA: Diagnosis present

## 2019-01-21 DIAGNOSIS — M25561 Pain in right knee: Secondary | ICD-10-CM | POA: Diagnosis present

## 2019-01-21 DIAGNOSIS — M25562 Pain in left knee: Secondary | ICD-10-CM | POA: Diagnosis not present

## 2019-01-21 NOTE — Therapy (Signed)
[facility] [address] [place], [address] Phone: [phone]   Fax:  [phone]  Physical Therapy Treatment  Patient Details  Name: Traci Drake MRN: [id number] Date of Birth: Jun 13, 1956 Referring Provider (PT): Dr. Wynelle Beckmann   Encounter Date: 01/21/2019  PT End of Session - 01/21/19 0915    Visit Number  11    Number of Visits  17    Date for PT Re-Evaluation  02/05/19    PT Start Time  0830    PT Stop Time  0915    PT Time Calculation (min)  45 min    Activity Tolerance  Patient tolerated treatment well;No increased pain    Behavior During Therapy  WFL for tasks assessed/performed       Past Medical History:  Diagnosis Date  . Asthma    seasonal  . Complication of anesthesia    needed albumin in PACU. and had vertigo  . Decreased libido without sexual dysfunction   . Menopause     Past Surgical History:  Procedure Laterality Date  . APPENDECTOMY  1989  . BREAST BIOPSY  2003  . KNEE ARTHROSCOPY Left    6-8 years ago  . 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  Subjective Assessment - 01/21/19 0835    Subjective  Patiinet reports she has "taken herself of all pain medication" which she is happy with. Reports she does have some trouble with stair ambulation that she wants to work on. 2/10 pain today.    Pertinent History  patient is a 63 year old female, SP L TKA 12/08/18. 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 Patient had previously been working out a few days a week, but has not since gyms have been closed. Enjoys gardening. She is currently ambulating  with RW around her home, that she reports she is able to do. Most pain with transitional movements STS, transferring in/out of the car, and negotiating stairs. Patient reports she has a 6in step up into the shower which is hard for her to use, but once she is in the shower she can stand and support herself to bathe.She has 2 steps to enter/exit her home without handrails. Reports 10/10 pain with transitional movements, 3/10 at best.    Limitations  Sitting;Lifting;Standing;Walking;House hold activities    How long can you sit comfortably?  takes a while to get comfortable, once comfortable can sit for an hour    How long can you stand comfortably?  43mns    How long can you walk comfortably?  couple minutes to bathroom and back    Diagnostic tests  Serial radiographs, no problems with components as reported by patient    Patient Stated Goals  Decrease pain to return to PLOF    Pain Onset  In the past 7 days       Ther-Ex -Bike seat setting7 > 6 > 5 full rotations; for [id number] -prone Left knee Extension stretch 2x 30sec hold - Prone hamstring contract relax 10sec contract; 20sec stretch/relax - Supine  Left knee Flexion 3x30sec -tibial inferior glide at 90  degrees flexion in prone 2x30sec (favorable to patient to address stiffness) - OMEGA leg 15# x10; 20# 2x 8 with good carry over of eccentric control - OMEGA knee ext 5# 3x 10 with bilat LE ext as needed and LLE only for lowering   Therapeutic Activity: Stair ambulation with bilat handrails for 1 trial, and without for 3 subsequent trials with TC for proper LE motion for normalized gait pattern Front set downs 2x 10 with bilat handrails with cuing to attempt to maintain hip/knee/ankle alignment and increase DF with heel maintaining contact on stair Forward step ups 2x 10 with bilat handrails with cuing to minimal pushing through RLE Stair ascent/descent trial following front step downs and step up, with 1 handrail, able to negotiate  steps with much more ease Knee ROM 115-10d                      PT Education - 01/21/19 0858    Education provided  Yes    Education Details  therex form    Person(s) Educated  Patient    Methods  Explanation;Demonstration;Tactile cues;Verbal cues;Handout    Comprehension  Verbalized understanding;Returned demonstration;Verbal cues required;Tactile cues required       PT Short Term Goals - 01/13/19 0953      PT SHORT TERM GOAL #1   Title  Pt will be independent with HEP in order to decrease knee pain and increase strength in order to improve pain-free function at home and work.    Time  4    Period  Weeks    Status  Partially Met        PT Long Term Goals - 01/13/19 0953      PT LONG TERM GOAL #1   Title  Patient will be able to complete 5 STS in 10seconds to demonstrate normal LE strength    Baseline  12/11/18 7 sec to complete 1 STS with 10/10 pain;   pt acutely exacerbated this date, will assess at next visit to obtain accurate appraisal.   Time  8    Period  Weeks    Status  On-going      PT LONG TERM GOAL #2   Title  Pt will decrease worst pain as reported on NPRS by at least 3 points in order to demonstrate clinically significant reduction in ankle/foot pain.    Baseline  12/11/18 10/10 pain with transfers; 2-3/10 this date, generally feeling well during the daytime    Time  8    Period  Weeks    Status  Achieved      PT LONG TERM GOAL #3   Title  Pt will increase 10MWT to at least 1.4 m/s in order to demonstrate normal community ambulation, with normalized gait pattern w/o AD to return to PLOF    Baseline  12/11/18 0.5ms with step to pattern with RW; AMB has progressed but remains slow, painful,and limited secondary to extension lag    Time  8    Period  Weeks    Status  On-going      PT LONG TERM GOAL #4   Title  Patient will demonstrate normalized stair negotiation up 2 steps without handrail to safely enter and exit home    Baseline   12/11/18 Able to ambulate backward with RW and step to gait leading with RLE    Time  8    Period  Weeks    Status  On-going  Plan - 01/21/19 0926    Clinical Impression Statement  PT continued progression for ROM and strengthening with good success, with some cuing needed for proper technique/form. PT spent time educating pt on stair negotiation with good demonstrations of safe ascent/descent. PT will continue progression as able to return patient to PLOF.    Personal Factors and Comorbidities  Age;Comorbidity 1;Past/Current Experience;Sex    Comorbidities  OA, previous sx on L knee    Examination-Activity Limitations  Bathing;Squat;Lift;Stairs;Bed Mobility;Bend;Locomotion Level;Stand;Carry;Transfers;Sit;Sleep    Examination-Participation Restrictions  Cleaning;Laundry;Community Activity;Driving    Stability/Clinical Decision Making  Evolving/Moderate complexity    Clinical Decision Making  Moderate    Rehab Potential  Good    Clinical Impairments Affecting Rehab Potential  Positive: motivation, age, partial replacement, active; Negative: history of L knee problems    PT Frequency  2x / week    PT Duration  8 weeks    PT Treatment/Interventions  Aquatic Therapy;Electrical Stimulation;Cryotherapy;Iontophoresis 12m/ml Dexamethasone;Moist Heat;DME Instruction;Functional mobility training;Therapeutic activities;Neuromuscular re-education;Dry needling;Passive range of motion;Joint Manipulations;Taping;Manual techniques;Therapeutic exercise;Gait training;Stair training;Patient/family education    PT Next Visit Plan  Continue to work on mobility    PT Home Exercise Plan  Continue HEP quad sets, supine abd, heel slides, knee ext    Consulted and Agree with Plan of Care  Patient       Patient will benefit from skilled therapeutic intervention in order to improve the following deficits and impairments:  Abnormal gait, Decreased activity tolerance, Decreased coordination, Decreased  strength, Increased fascial restricitons, Impaired flexibility, Pain, Postural dysfunction, Improper body mechanics, Decreased range of motion, Difficulty walking, Decreased endurance, Decreased mobility, Increased muscle spasms  Visit Diagnosis: 1. Acute pain of left knee   2. Muscle weakness (generalized)   3. Chronic pain of left knee   4. Chronic pain of right knee        Problem List Patient Active Problem List   Diagnosis Date Noted  . OA (osteoarthritis) of knee 12/08/2018  . Asthma, exercise induced 09/20/2014  . Overweight 09/20/2014   CShelton SilvasPT, DPT CShelton Silvas8/10/2018, 9:32 AM  [facility] [address] [place] [address]Phone: [phone]  Fax:  [phone] Name: CTANESHIA LORENCEMRN: [id number] of Birth: 402-24-1957

## 2019-01-23 ENCOUNTER — Ambulatory Visit: Admitting: Physical Therapy

## 2019-01-26 ENCOUNTER — Ambulatory Visit: Admitting: Physical Therapy

## 2019-01-26 ENCOUNTER — Other Ambulatory Visit: Payer: Self-pay

## 2019-01-26 ENCOUNTER — Encounter: Payer: Self-pay | Admitting: Physical Therapy

## 2019-01-26 DIAGNOSIS — M25562 Pain in left knee: Secondary | ICD-10-CM

## 2019-01-26 DIAGNOSIS — G8929 Other chronic pain: Secondary | ICD-10-CM

## 2019-01-26 DIAGNOSIS — M6281 Muscle weakness (generalized): Secondary | ICD-10-CM

## 2019-01-26 NOTE — Therapy (Signed)
[facility] [address] [place], [address] Phone: [phone]   Fax:  [phone]  Physical Therapy Treatment  Patient Details  Name: Traci Drake MRN: [id number] Date of Birth: 1956-04-17 No data recorded  Encounter Date: 01/26/2019  PT End of Session - 01/26/19 1300    Visit Number  12    Number of Visits  17    Date for PT Re-Evaluation  02/05/19    PT Start Time  1115    PT Stop Time  1200    PT Time Calculation (min)  45 min    Activity Tolerance  Patient tolerated treatment well;No increased pain    Behavior During Therapy  WFL for tasks assessed/performed       Past Medical History:  Diagnosis Date  . Asthma    seasonal  . Complication of anesthesia    needed albumin in PACU. and had vertigo  . Decreased libido without sexual dysfunction   . Menopause     Past Surgical History:  Procedure Laterality Date  . APPENDECTOMY  1989  . BREAST BIOPSY  2003  . KNEE ARTHROSCOPY Left    6-8 years ago  . 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  Subjective Assessment - 01/26/19 1117    Subjective  Patinet reports she is sore from helping her daughter move this weekend and is a little sore from that. Reports she is sleeping better, but is having increased soreness. 4/10 pain today    Pertinent History  patient is a 63 year old female, SP L TKA 12/08/18. 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 Patient had previously been working out a few days a week, but has not since gyms have been closed. Enjoys gardening. She is currently ambulating with RW around her home,  that she reports she is able to do. Most pain with transitional movements STS, transferring in/out of the car, and negotiating stairs. Patient reports she has a 6in step up into the shower which is hard for her to use, but once she is in the shower she can stand and support herself to bathe.She has 2 steps to enter/exit her home without handrails. Reports 10/10 pain with transitional movements, 3/10 at best.    Limitations  Sitting;Lifting;Standing;Walking;House hold activities    How long can you sit comfortably?  takes a while to get comfortable, once comfortable can sit for an hour    How long can you stand comfortably?  56mns    How long can you walk comfortably?  couple minutes to bathroom and back    Diagnostic tests  Serial radiographs, no problems with components as reported by patient    Patient Stated Goals  Decrease pain to return to PLOF    Pain Onset  In the past 7 days          Ther-Ex -Bike seat setting5back and forth progressed to  -prone Left knee Extesntion stretch x10sec hold multiple times until max ext hang x163m  -prone Left knee Flexion 3x30sec - OMEGA leg press LLE only 25# 2x 8 with good carry over of  eccentric control - OMEGA leg curl 10# 2x 5 with min cuing for full available ROM - OMEGA knee ext 5# 2x 6   Manual Patella mobilization all directions 19mns  Seated inf tibial/fibular distraction 5x 10sec hold; progressing to distraction with flexion x10 STM with trigger point release to hamstrings  4-120degrees                  PT Education - 01/26/19 1300    Education provided  Yes    Education Details  therex form    Person(s) Educated  Patient    Methods  Explanation;Demonstration;Verbal cues    Comprehension  Verbalized understanding;Returned demonstration;Verbal cues required       PT Short Term Goals - 01/13/19 0953      PT SHORT TERM GOAL #1   Title  Pt will be independent with HEP in order to decrease knee pain and increase  strength in order to improve pain-free function at home and work.    Time  4    Period  Weeks    Status  Partially Met        PT Long Term Goals - 01/13/19 0953      PT LONG TERM GOAL #1   Title  Patient will be able to complete 5 STS in 10seconds to demonstrate normal LE strength    Baseline  12/11/18 7 sec to complete 1 STS with 10/10 pain;   pt acutely exacerbated this date, will assess at next visit to obtain accurate appraisal.   Time  8    Period  Weeks    Status  On-going      PT LONG TERM GOAL #2   Title  Pt will decrease worst pain as reported on NPRS by at least 3 points in order to demonstrate clinically significant reduction in ankle/foot pain.    Baseline  12/11/18 10/10 pain with transfers; 2-3/10 this date, generally feeling well during the daytime    Time  8    Period  Weeks    Status  Achieved      PT LONG TERM GOAL #3   Title  Pt will increase 10MWT to at least 1.4 m/s in order to demonstrate normal community ambulation, with normalized gait pattern w/o AD to return to PLOF    Baseline  12/11/18 0.190m with step to pattern with RW; AMB has progressed but remains slow, painful,and limited secondary to extension lag    Time  8    Period  Weeks    Status  On-going      PT LONG TERM GOAL #4   Title  Patient will demonstrate normalized stair negotiation up 2 steps without handrail to safely enter and exit home    Baseline  12/11/18 Able to ambulate backward with RW and step to gait leading with RLE    Time  8    Period  Weeks    Status  On-going            Plan - 01/27/19 1344    Clinical Impression Statement  Patient with increased soreness today, exercise modified within pain limits. Patient reports good pain relief with manual techniques. PT initiated light hamstring activation/strengthening, with education on strengthening to increase AROM, but graded to prevent overactivity d/t patient with increased discomfort and cramping. PT will continue progression  for increased strength and ROM as able.    Examination-Activity Limitations  Bathing;Squat;Lift;Stairs;Bed Mobility;Bend;Locomotion Level;Stand;Carry;Transfers;Sit;Sleep    Examination-Participation Restrictions  Cleaning;Laundry;Community Activity;Driving  Stability/Clinical Decision Making  Evolving/Moderate complexity    Clinical Decision Making  Moderate    Rehab Potential  Good    Clinical Impairments Affecting Rehab Potential  Positive: motivation, age, partial replacement, active; Negative: history of L knee problems    PT Frequency  2x / week    PT Duration  8 weeks    PT Treatment/Interventions  Aquatic Therapy;Electrical Stimulation;Cryotherapy;Iontophoresis 90m/ml Dexamethasone;Moist Heat;DME Instruction;Functional mobility training;Therapeutic activities;Neuromuscular re-education;Dry needling;Passive range of motion;Joint Manipulations;Taping;Manual techniques;Therapeutic exercise;Gait training;Stair training;Patient/family education    PT Next Visit Plan  Continue to work on mobility    PT Home Exercise Plan  Continue HEP quad sets, supine abd, heel slides, knee ext    Consulted and Agree with Plan of Care  Patient       Patient will benefit from skilled therapeutic intervention in order to improve the following deficits and impairments:  Abnormal gait, Decreased activity tolerance, Decreased coordination, Decreased strength, Increased fascial restricitons, Impaired flexibility, Pain, Postural dysfunction, Improper body mechanics, Decreased range of motion, Difficulty walking, Decreased endurance, Decreased mobility, Increased muscle spasms  Visit Diagnosis: 1. Acute pain of left knee   2. Muscle weakness (generalized)   3. Chronic pain of left knee   4. Chronic pain of right knee        Problem List Patient Active Problem List   Diagnosis Date Noted  . OA (osteoarthritis) of knee 12/08/2018  . Asthma, exercise induced 09/20/2014  . Overweight 09/20/2014   CShelton SilvasPT, DPT CShelton Silvas8/04/2019, 3:09 PM  [facility] [address] [place] [address]Phone: [phone]  Fax:  [phone] Name: Traci RAKERSMRN: [id number] of Birth: 412/01/1956

## 2019-01-30 ENCOUNTER — Ambulatory Visit: Admitting: Physical Therapy

## 2019-02-03 ENCOUNTER — Ambulatory Visit: Admitting: Physical Therapy

## 2019-02-03 ENCOUNTER — Other Ambulatory Visit: Payer: Self-pay

## 2019-02-03 ENCOUNTER — Encounter: Payer: Self-pay | Admitting: Physical Therapy

## 2019-02-03 DIAGNOSIS — M25562 Pain in left knee: Secondary | ICD-10-CM | POA: Diagnosis not present

## 2019-02-03 DIAGNOSIS — G8929 Other chronic pain: Secondary | ICD-10-CM

## 2019-02-03 DIAGNOSIS — M25561 Pain in right knee: Secondary | ICD-10-CM

## 2019-02-03 DIAGNOSIS — M6281 Muscle weakness (generalized): Secondary | ICD-10-CM

## 2019-02-03 NOTE — Therapy (Signed)
[facility] [address], [place], [address] Phone: [phone]   Fax:  [phone]  Physical Therapy Treatment  Patient Details  Name: Traci Drake MRN: [id number] Date of Birth: 1955/07/16 No data recorded  Encounter Date: 02/03/2019  PT End of Session - 02/03/19 0908    Visit Number  13    Number of Visits  17    Date for PT Re-Evaluation  02/05/19    PT Start Time  0900    PT Stop Time  0945    PT Time Calculation (min)  45 min    Equipment Utilized During Treatment  Gait belt    Activity Tolerance  Patient tolerated treatment well;No increased pain    Behavior During Therapy  WFL for tasks assessed/performed       Past Medical History:  Diagnosis Date  . Asthma    seasonal  . Complication of anesthesia    needed albumin in PACU. and had vertigo  . Decreased libido without sexual dysfunction   . Menopause     Past Surgical History:  Procedure Laterality Date  . APPENDECTOMY  1989  . BREAST BIOPSY  2003  . KNEE ARTHROSCOPY Left    6-8 years ago  . 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  Subjective Assessment - 02/03/19 0903    Subjective  Patient reports she returned to work yesterday and was on her feet 6 hours. Reports she was sore following and used ice. Reports 3/10 pain this am.    Pertinent History  patient is a 63 year old female, SP L TKA 12/08/18. 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 Patient had previously been working out a few days a week, but has not since gyms have been closed. Enjoys gardening. She is currently ambulating with RW around  her home, that she reports she is able to do. Most pain with transitional movements STS, transferring in/out of the car, and negotiating stairs. Patient reports she has a 6in step up into the shower which is hard for her to use, but once she is in the shower she can stand and support herself to bathe.She has 2 steps to enter/exit her home without handrails. Reports 10/10 pain with transitional movements, 3/10 at best.    Limitations  Sitting;Lifting;Standing;Walking;House hold activities    How long can you sit comfortably?  takes a while to get comfortable, once comfortable can sit for an hour    How long can you stand comfortably?  43mns    How long can you walk comfortably?  couple minutes to bathroom and back       Ther-Ex -Bike seat setting5 > 4 full rotations; for AAROM475ms - Prone hamstring contract relax 10sec contract; 20sec stretch/relax - Supine  Left knee Flexion 3x30sec -tibial inferior glide at 90 degrees flexion in prone 2x30sec (favorable to patient to address stiffness) - OMEGA knee ext 5# 3x 03/25/09 with LLE only  - Step up onto 6in step x10; 8in 2x 10/8 with cuing for TKE and hip ext - SL squat with TRX band 3x 8 with cuing for  TKE with good carry over, less active knee ext than PROM - SL on foam ball toss at rebounder 3x 10 with occasional opposite foot down for balance.   6-122d                     PT Education - 02/03/19 0907    Education provided  Yes    Education Details  therex form    Person(s) Educated  Patient    Methods  Explanation;Demonstration;Tactile cues;Verbal cues    Comprehension  Verbalized understanding;Returned demonstration;Verbal cues required;Tactile cues required       PT Short Term Goals - 01/13/19 0953      PT SHORT TERM GOAL #1   Title  Pt will be independent with HEP in order to decrease knee pain and increase strength in order to improve pain-free function at home and work.    Time  4    Period  Weeks     Status  Partially Met        PT Long Term Goals - 01/13/19 0953      PT LONG TERM GOAL #1   Title  Patient will be able to complete 5 STS in 10seconds to demonstrate normal LE strength    Baseline  12/11/18 7 sec to complete 1 STS with 10/10 pain;   pt acutely exacerbated this date, will assess at next visit to obtain accurate appraisal.   Time  8    Period  Weeks    Status  On-going      PT LONG TERM GOAL #2   Title  Pt will decrease worst pain as reported on NPRS by at least 3 points in order to demonstrate clinically significant reduction in ankle/foot pain.    Baseline  12/11/18 10/10 pain with transfers; 2-3/10 this date, generally feeling well during the daytime    Time  8    Period  Weeks    Status  Achieved      PT LONG TERM GOAL #3   Title  Pt will increase 10MWT to at least 1.4 m/s in order to demonstrate normal community ambulation, with normalized gait pattern w/o AD to return to PLOF    Baseline  12/11/18 0.71ms with step to pattern with RW; AMB has progressed but remains slow, painful,and limited secondary to extension lag    Time  8    Period  Weeks    Status  On-going      PT LONG TERM GOAL #4   Title  Patient will demonstrate normalized stair negotiation up 2 steps without handrail to safely enter and exit home    Baseline  12/11/18 Able to ambulate backward with RW and step to gait leading with RLE    Time  8    Period  Weeks    Status  On-going            Plan - 02/03/19 1021    Clinical Impression Statement  PT continued therex progression with patient able to comply with cuing for proper form/technique, and no increased pain, only muscle fatigue noted. Pt continues to have difficulty with TKE in WB, with mild deficits passively in knee ext motion. PT will continue progression as able to restore motion and strength.    Personal Factors and Comorbidities  Age;Comorbidity 1;Past/Current Experience;Sex    Comorbidities  OA, previous sx on L knee     Examination-Activity Limitations  Bathing;Squat;Lift;Stairs;Bed Mobility;Bend;Locomotion Level;Stand;Carry;Transfers;Sit;Sleep    Stability/Clinical Decision Making  Evolving/Moderate complexity  Clinical Decision Making  Moderate    Rehab Potential  Good    Clinical Impairments Affecting Rehab Potential  Positive: motivation, age, partial replacement, active; Negative: history of L knee problems    PT Frequency  2x / week    PT Duration  8 weeks    PT Treatment/Interventions  Aquatic Therapy;Electrical Stimulation;Cryotherapy;Iontophoresis 43m/ml Dexamethasone;Moist Heat;DME Instruction;Functional mobility training;Therapeutic activities;Neuromuscular re-education;Dry needling;Passive range of motion;Joint Manipulations;Taping;Manual techniques;Therapeutic exercise;Gait training;Stair training;Patient/family education    PT Next Visit Plan  Continue to work on mobility    PT Home Exercise Plan  Continue HEP quad sets, supine abd, heel slides, knee ext    Consulted and Agree with Plan of Care  Patient       Patient will benefit from skilled therapeutic intervention in order to improve the following deficits and impairments:  Abnormal gait, Decreased activity tolerance, Decreased coordination, Decreased strength, Increased fascial restricitons, Impaired flexibility, Pain, Postural dysfunction, Improper body mechanics, Decreased range of motion, Difficulty walking, Decreased endurance, Decreased mobility, Increased muscle spasms  Visit Diagnosis: 1. Acute pain of left knee   2. Muscle weakness (generalized)   3. Chronic pain of left knee   4. Chronic pain of right knee        Problem List Patient Active Problem List   Diagnosis Date Noted  . OA (osteoarthritis) of knee 12/08/2018  . Asthma, exercise induced 09/20/2014  . Overweight 09/20/2014   CShelton SilvasPT, DPT CShelton Silvas8/18/2020, 10:23 AM  [facility] [address] [place] [address]Phone: [phone]  Fax:  [phone] Name: CSHAQUINA GILLHAMMRN: [id number] of Birth: 405/01/1956

## 2019-02-06 ENCOUNTER — Ambulatory Visit: Admitting: Physical Therapy

## 2019-02-06 ENCOUNTER — Encounter: Payer: Self-pay | Admitting: Physical Therapy

## 2019-02-06 ENCOUNTER — Other Ambulatory Visit: Payer: Self-pay

## 2019-02-06 DIAGNOSIS — M25561 Pain in right knee: Secondary | ICD-10-CM

## 2019-02-06 DIAGNOSIS — M6281 Muscle weakness (generalized): Secondary | ICD-10-CM

## 2019-02-06 DIAGNOSIS — G8929 Other chronic pain: Secondary | ICD-10-CM

## 2019-02-06 DIAGNOSIS — M25562 Pain in left knee: Secondary | ICD-10-CM

## 2019-02-06 NOTE — Therapy (Signed)
[facility] [address], [place], [address] Phone: [phone]   Fax:  [phone]  Physical Therapy Treatment  Patient Details  Name: Traci Drake MRN: [id number] Date of Birth: [birth year] No data recorded  Encounter Date: 02/06/2019  PT End of Session - 02/06/19 0836    Visit Number  14    Number of Visits  17    Date for PT Re-Evaluation  02/05/19    PT Start Time  0830    PT Stop Time  0915    PT Time Calculation (min)  45 min    Equipment Utilized During Treatment  Gait belt    Activity Tolerance  Patient tolerated treatment well;No increased pain    Behavior During Therapy  WFL for tasks assessed/performed       Past Medical History:  Diagnosis Date  . Asthma    seasonal  . Complication of anesthesia    needed albumin in PACU. and had vertigo  . Decreased libido without sexual dysfunction   . Menopause     Past Surgical History:  Procedure Laterality Date  . APPENDECTOMY  1989  . BREAST BIOPSY  2003  . KNEE ARTHROSCOPY Left    6-8 years ago  . 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  Subjective Assessment - 02/06/19 0833    Subjective  Pt reports she worked all day Wednesday and had to take a pain pill following. Reports 3/10 pain today.    Pertinent History  patient is a 63 year old female, SP L TKA 12/08/18. 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 Patient had previously been working out a few days a week, but has not since gyms have been closed. Enjoys gardening. She is currently ambulating with RW around her home, that she reports she is able to  do. Most pain with transitional movements STS, transferring in/out of the car, and negotiating stairs. Patient reports she has a 6in step up into the shower which is hard for her to use, but once she is in the shower she can stand and support herself to bathe.She has 2 steps to enter/exit her home without handrails. Reports 10/10 pain with transitional movements, 3/10 at best.    Limitations  Sitting;Lifting;Standing;Walking;House hold activities    How long can you sit comfortably?  takes a while to get comfortable, once comfortable can sit for an hour    How long can you stand comfortably?  89mns    How long can you walk comfortably?  couple minutes to bathroom and back    Diagnostic tests  Serial radiographs, no problems with components as reported by patient         Ther-Ex -Bike seat setting5 > 455fl rotations; for AAROM4m69m - Prone hamstring contract relax 10sec contract; 20sec stretch/relax -SupineLeft knee Flexion 5x10sec - 5x STS x2 trials, break in between fastest time 11.5 seconds prior - Slider lunges 3x 8 with min cuing initially for proper form with good carry over following - OMEGA knee ext 5# 3x 10  - OMEGA knee flex 5# 2x 6 with increased hamstring cramping  that subsides between sets - SL squat with TRX band 3x 8 with cuing for TKE with hip ext with good carry over following - SL on foam ball toss at rebounder 3x 10 with occasional opposite foot down for balance.   4-121d                       PT Education - 02/06/19 0835    Education provided  Yes    Education Details  therex form    Person(s) Educated  Patient    Methods  Explanation;Demonstration;Tactile cues;Verbal cues    Comprehension  Verbalized understanding;Returned demonstration;Verbal cues required;Tactile cues required       PT Short Term Goals - 01/13/19 0953      PT SHORT TERM GOAL #1   Title  Pt will be independent with HEP in order to decrease knee pain and increase  strength in order to improve pain-free function at home and work.    Time  4    Period  Weeks    Status  Partially Met        PT Long Term Goals - 02/06/19 1157      PT LONG TERM GOAL #1   Title  Patient will be able to complete 5 STS in 10seconds to demonstrate normal LE strength    Baseline  02/06/19 11.5sec without UE support with no increased pain    Time  8    Period  Weeks    Status  On-going      PT LONG TERM GOAL #2   Title  Pt will decrease worst pain as reported on NPRS by at least 3 points in order to demonstrate clinically significant reduction in ankle/foot pain.    Baseline  02/06/19 8/10 pain following working on her feet all day    Time  8    Status  On-going      PT LONG TERM GOAL #3   Title  Pt will increase 10MWT to at least 1.4 m/s in order to demonstrate normal community ambulation, with normalized gait pattern w/o AD to return to PLOF    Baseline  02/06/19 1.90ms no increased pain, no AD    Time  8    Period  Weeks    Status  Achieved      PT LONG TERM GOAL #4   Title  Patient will demonstrate normalized stair negotiation up 2 steps without handrail to safely enter and exit home    Baseline  02/06/19 able to negotiate 4 steps with reciprocol pattern, no handrails    Time  8    Period  Weeks    Status  Achieved            Plan - 02/06/19 02620   Clinical Impression Statement  PT reassessed goals this session, where pt is making good progress with motion, pain, and gait. Some remaining limitations in strength, inhibiting her in heavier household chores and squatting with lifting in her garden. PT will continue progression over next 4 weeks to ensure safety with progression to robust HEP for maintainence.    Personal Factors and Comorbidities  Age;Comorbidity 1;Past/Current Experience;Sex    Comorbidities  OA, previous sx on L knee    Examination-Activity Limitations  Bathing;Squat;Lift;Stairs;Bed Mobility;Bend;Locomotion  Level;Stand;Carry;Transfers;Sit;Sleep    Examination-Participation Restrictions  Cleaning;Laundry;Community Activity;Driving    Stability/Clinical Decision Making  Evolving/Moderate complexity    Clinical Decision Making  Moderate    Rehab Potential  Good  Clinical Impairments Affecting Rehab Potential  Positive: motivation, age, partial replacement, active; Negative: history of L knee problems    PT Frequency  2x / week    PT Duration  8 weeks    PT Treatment/Interventions  Aquatic Therapy;Electrical Stimulation;Cryotherapy;Iontophoresis 34m/ml Dexamethasone;Moist Heat;DME Instruction;Functional mobility training;Therapeutic activities;Neuromuscular re-education;Dry needling;Passive range of motion;Joint Manipulations;Taping;Manual techniques;Therapeutic exercise;Gait training;Stair training;Patient/family education    PT Next Visit Plan  Continue to work on mobility    PT Home Exercise Plan  Continue HEP quad sets, supine abd, heel slides, knee ext    Consulted and Agree with Plan of Care  Patient       Patient will benefit from skilled therapeutic intervention in order to improve the following deficits and impairments:  Abnormal gait, Decreased activity tolerance, Decreased coordination, Decreased strength, Increased fascial restricitons, Impaired flexibility, Pain, Postural dysfunction, Improper body mechanics, Decreased range of motion, Difficulty walking, Decreased endurance, Decreased mobility, Increased muscle spasms  Visit Diagnosis: Acute pain of left knee  Muscle weakness (generalized)  Chronic pain of left knee  Chronic pain of right knee     Problem List Patient Active Problem List   Diagnosis Date Noted  . OA (osteoarthritis) of knee 12/08/2018  . Asthma, exercise induced 09/20/2014  . Overweight 09/20/2014    CShelton Silvas8/21/2020, 9:13 AM  [facility] [address] [place]  [address]Phone: [phone]  Fax:  [phone] Name: CKOSISOCHUKWU GOLDBERGMRN: [id number] of Birth: 424-Sep-1957

## 2019-02-10 ENCOUNTER — Other Ambulatory Visit: Payer: Self-pay

## 2019-02-10 ENCOUNTER — Ambulatory Visit: Admitting: Physical Therapy

## 2019-02-10 ENCOUNTER — Encounter: Payer: Self-pay | Admitting: Physical Therapy

## 2019-02-10 DIAGNOSIS — M25562 Pain in left knee: Secondary | ICD-10-CM

## 2019-02-10 DIAGNOSIS — M6281 Muscle weakness (generalized): Secondary | ICD-10-CM

## 2019-02-10 DIAGNOSIS — G8929 Other chronic pain: Secondary | ICD-10-CM

## 2019-02-10 NOTE — Therapy (Signed)
[facility] [address] [place], [address] Phone: [phone]   Fax:  [phone]  Physical Therapy Treatment  Patient Details  Name: TYRONZA HAPPE MRN: [id number] Date of Birth: [birth year] No data recorded  Encounter Date: 02/10/2019  PT End of Session - 02/10/19 0947    Visit Number  15    Number of Visits  25    Date for PT Re-Evaluation  03/06/19    PT Start Time  0900    PT Stop Time  0945    PT Time Calculation (min)  45 min    Activity Tolerance  Patient tolerated treatment well;No increased pain    Behavior During Therapy  WFL for tasks assessed/performed       Past Medical History:  Diagnosis Date  . Asthma    seasonal  . Complication of anesthesia    needed albumin in PACU. and had vertigo  . Decreased libido without sexual dysfunction   . Menopause     Past Surgical History:  Procedure Laterality Date  . APPENDECTOMY  1989  . BREAST BIOPSY  2003  . KNEE ARTHROSCOPY Left    6-8 years ago  . 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  Subjective Assessment - 02/10/19 0913    Subjective  Patient reports she is a little stiff this morning after helping her daughter move over the weekend. Realizing she is having trouble bending knee to clear objects    Pertinent History  patient is a 63 year old female, SP L TKA 12/08/18. 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 Patient had previously been working out a few days a week, but has not since gyms have been closed. Enjoys gardening. She is currently ambulating with RW around her home, that she reports she is  able to do. Most pain with transitional movements STS, transferring in/out of the car, and negotiating stairs. Patient reports she has a 6in step up into the shower which is hard for her to use, but once she is in the shower she can stand and support herself to bathe.She has 2 steps to enter/exit her home without handrails. Reports 10/10 pain with transitional movements, 3/10 at best.    Limitations  Sitting;Lifting;Standing;Walking;House hold activities    How long can you sit comfortably?  takes a while to get comfortable, once comfortable can sit for an hour    How long can you stand comfortably?  63mns    How long can you walk comfortably?  couple minutes to bathroom and back    Diagnostic tests  Serial radiographs, no problems with components as reported by patient    Patient Stated Goals  Decrease pain to return to PLOF         Ther-Ex -Bike seat setting5> 416fl rotations; for AAROM4m30m - Prone hamstring contract relax 10sec contract; 20sec stretch/relax -SupineLeft knee Flexion 5x10sec - SL RDL BW 3x 6/8/8 with demo and max cuing initially for proper technique with good carry over following; difficulty with TKE - TKE with BlueTB 3x 10 cuing initially for TKE without post hip translation with good  carry over following - OMEGA knee ext 10# 3x 7/6/6 - OMEGA knee flex 10# 2x 6 with increased hamstring cramping that subsides between sets                        PT Education - 02/10/19 0936    Education provided  Yes    Education Details  therex form    Person(s) Educated  Patient    Methods  Explanation;Demonstration;Tactile cues;Verbal cues    Comprehension  Verbalized understanding;Returned demonstration;Verbal cues required;Tactile cues required       PT Short Term Goals - 01/13/19 0953      PT SHORT TERM GOAL #1   Title  Pt will be independent with HEP in order to decrease knee pain and increase strength in order to improve pain-free function at  home and work.    Time  4    Period  Weeks    Status  Partially Met        PT Long Term Goals - 02/06/19 5462      PT LONG TERM GOAL #1   Title  Patient will be able to complete 5 STS in 10seconds to demonstrate normal LE strength    Baseline  02/06/19 11.5sec without UE support with no increased pain    Time  8    Period  Weeks    Status  On-going      PT LONG TERM GOAL #2   Title  Pt will decrease worst pain as reported on NPRS by at least 3 points in order to demonstrate clinically significant reduction in ankle/foot pain.    Baseline  02/06/19 8/10 pain following working on her feet all day    Time  8    Status  On-going      PT LONG TERM GOAL #3   Title  Pt will increase 10MWT to at least 1.4 m/s in order to demonstrate normal community ambulation, with normalized gait pattern w/o AD to return to PLOF    Baseline  02/06/19 1.22ms no increased pain, no AD    Time  8    Period  Weeks    Status  Achieved      PT LONG TERM GOAL #4   Title  Patient will demonstrate normalized stair negotiation up 2 steps without handrail to safely enter and exit home    Baseline  02/06/19 able to negotiate 4 steps with reciprocol pattern, no handrails    Time  8    Period  Weeks    Status  Achieved            Plan - 02/10/19 1025    Clinical Impression Statement  PT continued therex progression for LE activation and strengthening, focus on TKE and hamstring activation. Patinet needs cuing for proper technique/form of therex, but is able to comply with all cuing and complete with accuracy with good motivation throughout session. PT will continue progression as able.    Personal Factors and Comorbidities  Age;Comorbidity 1;Past/Current Experience;Sex    Comorbidities  OA, previous sx on L knee    Examination-Activity Limitations  Bathing;Squat;Lift;Stairs;Bed Mobility;Bend;Locomotion Level;Stand;Carry;Transfers;Sit;Sleep    Examination-Participation Restrictions  Cleaning;Laundry;Community  Activity;Driving    Stability/Clinical Decision Making  Evolving/Moderate complexity    Clinical Decision Making  Moderate    Rehab Potential  Good    Clinical Impairments Affecting Rehab Potential  Positive: motivation, age, partial replacement, active; Negative: history of L knee problems    PT Frequency  2x / week    PT Duration  8 weeks    PT Treatment/Interventions  Aquatic Therapy;Electrical Stimulation;Cryotherapy;Iontophoresis 38m/ml Dexamethasone;Moist Heat;DME Instruction;Functional mobility training;Therapeutic activities;Neuromuscular re-education;Dry needling;Passive range of motion;Joint Manipulations;Taping;Manual techniques;Therapeutic exercise;Gait training;Stair training;Patient/family education    PT Next Visit Plan  Continue to work on mobility    PT Home Exercise Plan  Continue HEP quad sets, supine abd, heel slides, knee ext    Consulted and Agree with Plan of Care  Patient       Patient will benefit from skilled therapeutic intervention in order to improve the following deficits and impairments:  Abnormal gait, Decreased activity tolerance, Decreased coordination, Decreased strength, Increased fascial restricitons, Impaired flexibility, Pain, Postural dysfunction, Improper body mechanics, Decreased range of motion, Difficulty walking, Decreased endurance, Decreased mobility, Increased muscle spasms  Visit Diagnosis: Acute pain of left knee  Muscle weakness (generalized)  Chronic pain of left knee     Problem List Patient Active Problem List   Diagnosis Date Noted  . OA (osteoarthritis) of knee 12/08/2018  . Asthma, exercise induced 09/20/2014  . Overweight 09/20/2014   CShelton SilvasPT, DPT CShelton Silvas8/25/2020, 10:27 AM  [facility] [address] [place] [address]Phone: [phone]  Fax:  [phone] Name: CTERASA ORSINIMRN: [id number] of Birth:  4September 22, 1957

## 2019-02-13 ENCOUNTER — Encounter: Payer: Self-pay | Admitting: Physical Therapy

## 2019-02-13 ENCOUNTER — Ambulatory Visit: Admitting: Physical Therapy

## 2019-02-13 ENCOUNTER — Other Ambulatory Visit: Payer: Self-pay

## 2019-02-13 DIAGNOSIS — G8929 Other chronic pain: Secondary | ICD-10-CM

## 2019-02-13 DIAGNOSIS — M25562 Pain in left knee: Secondary | ICD-10-CM | POA: Diagnosis not present

## 2019-02-13 DIAGNOSIS — M6281 Muscle weakness (generalized): Secondary | ICD-10-CM

## 2019-02-13 NOTE — Therapy (Signed)
[facility] [address], [place], [address] Phone: [phone]   Fax:  [phone]  Physical Therapy Treatment  Patient Details  Name: Traci Drake MRN: [id number] Date of Birth: 01-Oct-1955 No data recorded  Encounter Date: 02/13/2019  PT End of Session - 02/13/19 0838    Visit Number  16    Number of Visits  25    Date for PT Re-Evaluation  03/06/19    PT Start Time  0830    PT Stop Time  0915    PT Time Calculation (min)  45 min    Activity Tolerance  Patient tolerated treatment well;No increased pain    Behavior During Therapy  WFL for tasks assessed/performed       Past Medical History:  Diagnosis Date  . Asthma    seasonal  . Complication of anesthesia    needed albumin in PACU. and had vertigo  . Decreased libido without sexual dysfunction   . Menopause     Past Surgical History:  Procedure Laterality Date  . APPENDECTOMY  1989  . BREAST BIOPSY  2003  . KNEE ARTHROSCOPY Left    6-8 years ago  . 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  Subjective Assessment - 02/13/19 0832    Subjective  Some increased soreness following last session and being up all day yesterday teaching. Reports 5/10 pain today.    Pertinent History  patient is a 63 year old female, SP L TKA 12/08/18. 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 Patient had previously been working out a few days a week, but has not since gyms have been closed. Enjoys gardening. She is currently ambulating with RW around her home, that she reports she is able to do. Most pain with transitional movements  STS, transferring in/out of the car, and negotiating stairs. Patient reports she has a 6in step up into the shower which is hard for her to use, but once she is in the shower she can stand and support herself to bathe.She has 2 steps to enter/exit her home without handrails. Reports 10/10 pain with transitional movements, 3/10 at best.    Limitations  Sitting;Lifting;Standing;Walking;House hold activities    How long can you sit comfortably?  takes a while to get comfortable, once comfortable can sit for an hour    How long can you stand comfortably?  35mns    How long can you walk comfortably?  couple minutes to bathroom and back    Diagnostic tests  Serial radiographs, no problems with components as reported by patient    Patient Stated Goals  Decrease pain to return to PLOF    Pain Onset  In the past 7 days       Ther-Ex -Bike seat setting5> 474fl rotations; for AAROM4m36m - Seated knee flex with inf tibial mob x15 5sec holds - TKE with BlueTB 2x 10 cuing for hip with knee ext - Backward walking for TKE encouragement [id number] 3x [id number] nordic hamstring curl with ball; 1 set of 8 complete, second set too much ant knee pain -  RTB prone hamstring curls 3x 8 - OMEGA knee ext 15# 3x 6 with bilat UE lift LLE only lower (attempted LLE only, unable) - OMEGA knee flex 15#32x 6 with increased hamstring cramping that subsides between sets - OMEGA leg press 20# 3x 10 with heel push                         PT Education - 02/13/19 0834    Education provided  Yes    Education Details  therex form    Person(s) Educated  Patient    Methods  Explanation;Demonstration;Tactile cues;Verbal cues    Comprehension  Verbalized understanding;Returned demonstration;Verbal cues required       PT Short Term Goals - 01/13/19 0953      PT SHORT TERM GOAL #1   Title  Pt will be independent with HEP in order to decrease knee pain and increase strength in order to improve  pain-free function at home and work.    Time  4    Period  Weeks    Status  Partially Met        PT Long Term Goals - 02/06/19 1610      PT LONG TERM GOAL #1   Title  Patient will be able to complete 5 STS in 10seconds to demonstrate normal LE strength    Baseline  02/06/19 11.5sec without UE support with no increased pain    Time  8    Period  Weeks    Status  On-going      PT LONG TERM GOAL #2   Title  Pt will decrease worst pain as reported on NPRS by at least 3 points in order to demonstrate clinically significant reduction in ankle/foot pain.    Baseline  02/06/19 8/10 pain following working on her feet all day    Time  8    Status  On-going      PT LONG TERM GOAL #3   Title  Pt will increase 10MWT to at least 1.4 m/s in order to demonstrate normal community ambulation, with normalized gait pattern w/o AD to return to PLOF    Baseline  02/06/19 1.20ms no increased pain, no AD    Time  8    Period  Weeks    Status  Achieved      PT LONG TERM GOAL #4   Title  Patient will demonstrate normalized stair negotiation up 2 steps without handrail to safely enter and exit home    Baseline  02/06/19 able to negotiate 4 steps with reciprocol pattern, no handrails    Time  8    Period  Weeks    Status  Achieved            Plan - 02/13/19 09604   Clinical Impression Statement  PT continued therex progression for LE strengthening and ROM with good success. Patient very motivatied throughout session, but unable to complete therex in kneeling position. Patient with increased pain reponse with hamstring activation, but is able to modulate this with rest breaks. Improvement with TKE, with some continued difficulty. PT will continue progression as able.    Personal Factors and Comorbidities  Age;Comorbidity 1;Past/Current Experience;Sex    Comorbidities  OA, previous sx on L knee    Examination-Activity Limitations  Bathing;Squat;Lift;Stairs;Bed Mobility;Bend;Locomotion  Level;Stand;Carry;Transfers;Sit;Sleep    Stability/Clinical Decision Making  Evolving/Moderate complexity    Clinical Decision Making  Moderate    Rehab Potential  Good  Clinical Impairments Affecting Rehab Potential  Positive: motivation, age, partial replacement, active; Negative: history of L knee problems    PT Frequency  2x / week    PT Duration  8 weeks    PT Treatment/Interventions  Aquatic Therapy;Electrical Stimulation;Cryotherapy;Iontophoresis 38m/ml Dexamethasone;Moist Heat;DME Instruction;Functional mobility training;Therapeutic activities;Neuromuscular re-education;Dry needling;Passive range of motion;Joint Manipulations;Taping;Manual techniques;Therapeutic exercise;Gait training;Stair training;Patient/family education    PT Next Visit Plan  Continue to work on mobility    PT Home Exercise Plan  Continue HEP quad sets, supine abd, heel slides, knee ext    Consulted and Agree with Plan of Care  Patient       Patient will benefit from skilled therapeutic intervention in order to improve the following deficits and impairments:  Abnormal gait, Decreased activity tolerance, Decreased coordination, Decreased strength, Increased fascial restricitons, Impaired flexibility, Pain, Postural dysfunction, Improper body mechanics, Decreased range of motion, Difficulty walking, Decreased endurance, Decreased mobility, Increased muscle spasms  Visit Diagnosis: Acute pain of left knee  Muscle weakness (generalized)  Chronic pain of left knee  Chronic pain of right knee     Problem List Patient Active Problem List   Diagnosis Date Noted  . OA (osteoarthritis) of knee 12/08/2018  . Asthma, exercise induced 09/20/2014  . Overweight 09/20/2014   CShelton SilvasPT, DPT CShelton Silvas8/28/2020, 9:28 AM  [facility] [address] [place] [address]Phone: [phone]  Fax:  [phone] Name: CPANG ROBERSMRN: [id number] of Birth: 01/20/1956-02-22

## 2019-02-17 ENCOUNTER — Encounter: Payer: Self-pay | Admitting: Physical Therapy

## 2019-02-17 ENCOUNTER — Ambulatory Visit: Attending: Student | Admitting: Physical Therapy

## 2019-02-17 ENCOUNTER — Other Ambulatory Visit: Payer: Self-pay

## 2019-02-17 DIAGNOSIS — M6281 Muscle weakness (generalized): Secondary | ICD-10-CM

## 2019-02-17 DIAGNOSIS — M25562 Pain in left knee: Secondary | ICD-10-CM | POA: Insufficient documentation

## 2019-02-17 DIAGNOSIS — G8929 Other chronic pain: Secondary | ICD-10-CM | POA: Insufficient documentation

## 2019-02-17 DIAGNOSIS — M25561 Pain in right knee: Secondary | ICD-10-CM | POA: Insufficient documentation

## 2019-02-17 NOTE — Therapy (Signed)
[facility] [address], [place], [address] Phone: [phone]   Fax:  [phone]  Physical Therapy Treatment  Patient Details  Name: Traci Drake MRN: [id number] Date of Birth: 03/23/1956 No data recorded  Encounter Date: 02/17/2019  PT End of Session - 02/17/19 1036    Visit Number  17    Number of Visits  25    Date for PT Re-Evaluation  03/06/19    PT Start Time  1032    PT Stop Time  1115    PT Time Calculation (min)  43 min    Equipment Utilized During Treatment  Gait belt    Activity Tolerance  Patient tolerated treatment well;No increased pain    Behavior During Therapy  WFL for tasks assessed/performed       Past Medical History:  Diagnosis Date  . Asthma    seasonal  . Complication of anesthesia    needed albumin in PACU. and had vertigo  . Decreased libido without sexual dysfunction   . Menopause     Past Surgical History:  Procedure Laterality Date  . APPENDECTOMY  1989  . BREAST BIOPSY  2003  . KNEE ARTHROSCOPY Left    6-8 years ago  . 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  Subjective Assessment - 02/17/19 1034    Subjective  Some soreness in front of the knee. Reports 3/10 pain this am.    Pertinent History  patient is a 63 year old female, SP L TKA 12/08/18. 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 Patient had previously been working out a few days a week, but has not since gyms have been closed. Enjoys gardening. She is currently ambulating with RW around her home, that she reports she is able to do. Most pain with transitional movements  STS, transferring in/out of the car, and negotiating stairs. Patient reports she has a 6in step up into the shower which is hard for her to use, but once she is in the shower she can stand and support herself to bathe.She has 2 steps to enter/exit her home without handrails. Reports 10/10 pain with transitional movements, 3/10 at best.    Limitations  Sitting;Lifting;Standing;Walking;House hold activities    How long can you sit comfortably?  takes a while to get comfortable, once comfortable can sit for an hour    How long can you stand comfortably?  55mns    How long can you walk comfortably?  couple minutes to bathroom and back    Diagnostic tests  Serial radiographs, no problems with components as reported by patient    Patient Stated Goals  Decrease pain to return to PLOF    Currently in Pain?  Yes           Ther-Ex -Bike seat setting4> 28fl rotations; for AAROM4m57m - Seated knee flex stretch x3 10sec hold 118d - Supine heel prop with post femur mob GIII 4 bouts 30sec - Elevated bridge from 12in step 3x 8 (attempted single leg, unable) cuing for 20% push through RLE with good carry over - SL RDL 3x  8 with cuing for TKE and hip ext with stand phase with good carry over following; unilateral UE support as needed - Step up onto 12in step with eccentric lower 3x 6 with cuing for full hip and kne ext  - OMEGA knee ext15# 3x8 with bilat UE lift LLE only lower (most help needed only with initiation) - OMEGA knee flex15# 3 x 6 with increased hamstring cramping that subsides between sets   flex:118 Ext: 6                      PT Education - 02/17/19 1038    Education provided  Yes    Education Details  therex form    Person(s) Educated  Patient    Methods  Explanation;Demonstration;Verbal cues    Comprehension  Verbalized understanding;Returned demonstration;Verbal cues required       PT Short Term Goals - 01/13/19 0953      PT SHORT TERM GOAL #1    Title  Pt will be independent with HEP in order to decrease knee pain and increase strength in order to improve pain-free function at home and work.    Time  4    Period  Weeks    Status  Partially Met        PT Long Term Goals - 02/06/19 8185      PT LONG TERM GOAL #1   Title  Patient will be able to complete 5 STS in 10seconds to demonstrate normal LE strength    Baseline  02/06/19 11.5sec without UE support with no increased pain    Time  8    Period  Weeks    Status  On-going      PT LONG TERM GOAL #2   Title  Pt will decrease worst pain as reported on NPRS by at least 3 points in order to demonstrate clinically significant reduction in ankle/foot pain.    Baseline  02/06/19 8/10 pain following working on her feet all day    Time  8    Status  On-going      PT LONG TERM GOAL #3   Title  Pt will increase 10MWT to at least 1.4 m/s in order to demonstrate normal community ambulation, with normalized gait pattern w/o AD to return to PLOF    Baseline  02/06/19 1.77ms no increased pain, no AD    Time  8    Period  Weeks    Status  Achieved      PT LONG TERM GOAL #4   Title  Patient will demonstrate normalized stair negotiation up 2 steps without handrail to safely enter and exit home    Baseline  02/06/19 able to negotiate 4 steps with reciprocol pattern, no handrails    Time  8    Period  Weeks    Status  Achieved            Plan - 02/17/19 1109    Clinical Impression Statement  PT continued therex progression with compound movements with good success. Pt reports no increased pain, only muscle fatigue throughout therex. Some discomfort at patellar tendon that subsides during rest between sets. Patient continues to need some cuing for TKE with hip ext, which she is able to correct somewhat. PT will continue progression as able to improve strength and functional movement.    Personal Factors and Comorbidities  Age;Comorbidity 1;Past/Current Experience;Sex    Comorbidities   OA, previous sx on L knee  Examination-Activity Limitations  Bathing;Squat;Lift;Stairs;Bed Mobility;Bend;Locomotion Level;Stand;Carry;Transfers;Sit;Sleep    Examination-Participation Restrictions  Cleaning;Laundry;Community Activity;Driving    Stability/Clinical Decision Making  Evolving/Moderate complexity    Clinical Decision Making  Moderate    Rehab Potential  Good    Clinical Impairments Affecting Rehab Potential  Positive: motivation, age, partial replacement, active; Negative: history of L knee problems    PT Frequency  2x / week    PT Duration  8 weeks    PT Treatment/Interventions  Aquatic Therapy;Electrical Stimulation;Cryotherapy;Iontophoresis 1m/ml Dexamethasone;Moist Heat;DME Instruction;Functional mobility training;Therapeutic activities;Neuromuscular re-education;Dry needling;Passive range of motion;Joint Manipulations;Taping;Manual techniques;Therapeutic exercise;Gait training;Stair training;Patient/family education    PT Next Visit Plan  Continue to work on mobility    PT Home Exercise Plan  Continue HEP quad sets, supine abd, heel slides, knee ext    Consulted and Agree with Plan of Care  Patient       Patient will benefit from skilled therapeutic intervention in order to improve the following deficits and impairments:  Abnormal gait, Decreased activity tolerance, Decreased coordination, Decreased strength, Increased fascial restricitons, Impaired flexibility, Pain, Postural dysfunction, Improper body mechanics, Decreased range of motion, Difficulty walking, Decreased endurance, Decreased mobility, Increased muscle spasms  Visit Diagnosis: Acute pain of left knee  Muscle weakness (generalized)  Chronic pain of left knee     Problem List Patient Active Problem List   Diagnosis Date Noted  . OA (osteoarthritis) of knee 12/08/2018  . Asthma, exercise induced 09/20/2014  . Overweight 09/20/2014   CShelton SilvasPT, DPT CShelton Silvas9/06/2018, 11:26 AM  [facility] [address] [place] [address]Phone: [phone]  Fax:  [phone] Name: Traci MADUROMRN: [id number] of Birth: 4August 02, 1957

## 2019-02-20 ENCOUNTER — Ambulatory Visit: Admitting: Physical Therapy

## 2019-02-24 ENCOUNTER — Ambulatory Visit: Admitting: Physical Therapy

## 2019-02-27 ENCOUNTER — Ambulatory Visit: Admitting: Physical Therapy

## 2019-03-03 ENCOUNTER — Encounter: Payer: Self-pay | Admitting: Physical Therapy

## 2019-03-03 ENCOUNTER — Other Ambulatory Visit: Payer: Self-pay

## 2019-03-03 ENCOUNTER — Ambulatory Visit: Admitting: Physical Therapy

## 2019-03-03 DIAGNOSIS — M25562 Pain in left knee: Secondary | ICD-10-CM | POA: Diagnosis not present

## 2019-03-03 DIAGNOSIS — M6281 Muscle weakness (generalized): Secondary | ICD-10-CM

## 2019-03-03 DIAGNOSIS — M25561 Pain in right knee: Secondary | ICD-10-CM

## 2019-03-03 DIAGNOSIS — G8929 Other chronic pain: Secondary | ICD-10-CM

## 2019-03-03 NOTE — Therapy (Signed)
[facility] [address], [place], [address] Phone: [phone]   Fax:  [phone]  Physical Therapy Treatment  Patient Details  Name: Traci Drake MRN: [id number] Date of Birth: 1955-12-13 No data recorded  Encounter Date: 03/03/2019  PT End of Session - 03/03/19 0940    Visit Number  18    Number of Visits  25    Date for PT Re-Evaluation  03/06/19    PT Start Time  0901    PT Stop Time  0943    PT Time Calculation (min)  42 min    Activity Tolerance  Patient tolerated treatment well;No increased pain    Behavior During Therapy  WFL for tasks assessed/performed       Past Medical History:  Diagnosis Date  . Asthma    seasonal  . Complication of anesthesia    needed albumin in PACU. and had vertigo  . Decreased libido without sexual dysfunction   . Menopause     Past Surgical History:  Procedure Laterality Date  . APPENDECTOMY  1989  . BREAST BIOPSY  2003  . KNEE ARTHROSCOPY Left    6-8 years ago  . 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  Subjective Assessment - 03/03/19 0906    Subjective  Reports minimal pain this am, is doing well overall.    Pertinent History  patient is a 63 year old female, SP L TKA 12/08/18. 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 Patient had previously been working out a few days a week, but has not since gyms have been closed. Enjoys gardening. She is currently ambulating with RW around her home, that she reports she is able to do. Most pain with transitional movements STS, transferring in/out of the car, and negotiating stairs.  Patient reports she has a 6in step up into the shower which is hard for her to use, but once she is in the shower she can stand and support herself to bathe.She has 2 steps to enter/exit her home without handrails. Reports 10/10 pain with transitional movements, 3/10 at best.    Limitations  Sitting;Lifting;Standing;Walking;House hold activities    How long can you sit comfortably?  takes a while to get comfortable, once comfortable can sit for an hour    How long can you stand comfortably?  39mns    How long can you walk comfortably?  couple minutes to bathroom and back    Diagnostic tests  Serial radiographs, no problems with components as reported by patient    Patient Stated Goals  Decrease pain to return to PLOF          Ther-Ex -Bike seat setting4> 247fl rotations; for AAROM4m47m -Seated knee flex stretch with inferior glide x6 5-10sec hold 118d - Supine rapid knee flex kicking into bosu ball 3x 30secwith cuing for speed and full flexion on LLE - Elevated bridge from 12in step 3x 8 (attempted single leg, unable) cuing for 20% push through RLE with good carry over, good muscle fatigue - SL RDL 3x 8 with cuing for TKE and hip ext with stand  phase with good carry over following; unilateral UE support as needed - Squat with RLE on balance stone 3x 8 with noted muscle fatigue, min cuing for hip with knee TKE  flex:116 Ext: 5                      PT Education - 03/03/19 0934    Education provided  Yes    Education Details  therex form    Person(s) Educated  Patient    Methods  Explanation;Demonstration;Tactile cues;Verbal cues    Comprehension  Verbalized understanding;Returned demonstration;Verbal cues required       PT Short Term Goals - 01/13/19 0953      PT SHORT TERM GOAL #1   Title  Pt will be independent with HEP in order to decrease knee pain and increase strength in order to improve pain-free function at home and work.    Time  4    Period   Weeks    Status  Partially Met        PT Long Term Goals - 02/06/19 3846      PT LONG TERM GOAL #1   Title  Patient will be able to complete 5 STS in 10seconds to demonstrate normal LE strength    Baseline  02/06/19 11.5sec without UE support with no increased pain    Time  8    Period  Weeks    Status  On-going      PT LONG TERM GOAL #2   Title  Pt will decrease worst pain as reported on NPRS by at least 3 points in order to demonstrate clinically significant reduction in ankle/foot pain.    Baseline  02/06/19 8/10 pain following working on her feet all day    Time  8    Status  On-going      PT LONG TERM GOAL #3   Title  Pt will increase 10MWT to at least 1.4 m/s in order to demonstrate normal community ambulation, with normalized gait pattern w/o AD to return to PLOF    Baseline  02/06/19 1.41ms no increased pain, no AD    Time  8    Period  Weeks    Status  Achieved      PT LONG TERM GOAL #4   Title  Patient will demonstrate normalized stair negotiation up 2 steps without handrail to safely enter and exit home    Baseline  02/06/19 able to negotiate 4 steps with reciprocol pattern, no handrails    Time  8    Period  Weeks    Status  Achieved            Plan - 03/03/19 1324    Clinical Impression Statement  PT continued therex progression for LE strengthening and motion with emphasis on knee flexor strengthening as patient is lacking AROM from availible PROM in knee flexion. Patient is able to complete all therex wih proper form/muscle activation following demo and min cuing from PT for proper form with no increased pain complaints only noted muscle fatigue. PT will reassess patients goals next visit to determine DC readiness.    Personal Factors and Comorbidities  Age;Comorbidity 1;Past/Current Experience;Sex    Comorbidities  OA, previous sx on L knee    Examination-Activity Limitations  Bathing;Squat;Lift;Stairs;Bed Mobility;Bend;Locomotion  Level;Stand;Carry;Transfers;Sit;Sleep    Stability/Clinical Decision Making  Evolving/Moderate complexity    Clinical Decision Making  Moderate    Rehab Potential  Good    Clinical Impairments Affecting Rehab  Potential  Positive: motivation, age, partial replacement, active; Negative: history of L knee problems    PT Frequency  2x / week    PT Duration  8 weeks    PT Treatment/Interventions  Aquatic Therapy;Electrical Stimulation;Cryotherapy;Iontophoresis 60m/ml Dexamethasone;Moist Heat;DME Instruction;Functional mobility training;Therapeutic activities;Neuromuscular re-education;Dry needling;Passive range of motion;Joint Manipulations;Taping;Manual techniques;Therapeutic exercise;Gait training;Stair training;Patient/family education    PT Next Visit Plan  Continue to work on mobility    PT Home Exercise Plan  Continue HEP quad sets, supine abd, heel slides, knee ext    Consulted and Agree with Plan of Care  Patient       Patient will benefit from skilled therapeutic intervention in order to improve the following deficits and impairments:  Abnormal gait, Decreased activity tolerance, Decreased coordination, Decreased strength, Increased fascial restricitons, Impaired flexibility, Pain, Postural dysfunction, Improper body mechanics, Decreased range of motion, Difficulty walking, Decreased endurance, Decreased mobility, Increased muscle spasms  Visit Diagnosis: Acute pain of left knee  Muscle weakness (generalized)  Chronic pain of left knee  Chronic pain of right knee     Problem List Patient Active Problem List   Diagnosis Date Noted  . OA (osteoarthritis) of knee 12/08/2018  . Asthma, exercise induced 09/20/2014  . Overweight 09/20/2014   CShelton SilvasPT, DPT CShelton Silvas9/15/2020, 1:26 PM  [facility] [address] [place] [address]Phone: [phone]  Fax:  [phone] Name: Traci CHERNMRN: [id number] of Birth: 05/30/1956-02-09

## 2019-03-05 ENCOUNTER — Ambulatory Visit: Admitting: Physical Therapy

## 2019-03-10 ENCOUNTER — Encounter: Payer: Self-pay | Admitting: Physical Therapy

## 2019-03-10 ENCOUNTER — Other Ambulatory Visit: Payer: Self-pay

## 2019-03-10 ENCOUNTER — Ambulatory Visit: Admitting: Physical Therapy

## 2019-03-10 DIAGNOSIS — M6281 Muscle weakness (generalized): Secondary | ICD-10-CM

## 2019-03-10 DIAGNOSIS — G8929 Other chronic pain: Secondary | ICD-10-CM

## 2019-03-10 DIAGNOSIS — M25561 Pain in right knee: Secondary | ICD-10-CM

## 2019-03-10 DIAGNOSIS — M25562 Pain in left knee: Secondary | ICD-10-CM

## 2019-03-10 NOTE — Therapy (Signed)
[facility] [address], [place], [address] Phone: [phone]   Fax:  [phone]  Physical Therapy Treatment  Patient Details  Name: Traci Drake MRN: [id number] Date of Birth: [birth year] No data recorded  Encounter Date: 03/10/2019  PT End of Session - 03/10/19 1521    Visit Number  19    Number of Visits  25    Date for PT Re-Evaluation  03/06/19    PT Start Time  0317    PT Stop Time  0350    PT Time Calculation (min)  33 min    Activity Tolerance  Patient tolerated treatment well;No increased pain    Behavior During Therapy  WFL for tasks assessed/performed       Past Medical History:  Diagnosis Date  . Asthma    seasonal  . Complication of anesthesia    needed albumin in PACU. and had vertigo  . Decreased libido without sexual dysfunction   . Menopause     Past Surgical History:  Procedure Laterality Date  . APPENDECTOMY  1989  . BREAST BIOPSY  2003  . KNEE ARTHROSCOPY Left    6-8 years ago  . 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  Subjective Assessment - 03/10/19 1519    Subjective  Patinet is doing well, going on walks with her husband, feels good overall.    Pertinent History  patient is a 63 year old female, SP L TKA 12/08/18. 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 Patient had previously been working out a few days a week, but has not since gyms have been closed. Enjoys gardening. She is currently ambulating with RW around her home, that she reports she is able to do. Most pain with transitional movements STS, transferring in/out of the car,  and negotiating stairs. Patient reports she has a 6in step up into the shower which is hard for her to use, but once she is in the shower she can stand and support herself to bathe.She has 2 steps to enter/exit her home without handrails. Reports 10/10 pain with transitional movements, 3/10 at best.    Limitations  Sitting;Lifting;Standing;Walking;House hold activities    How long can you sit comfortably?  takes a while to get comfortable, once comfortable can sit for an hour    How long can you stand comfortably?  35mns    How long can you walk comfortably?  couple minutes to bathroom and back    Diagnostic tests  Serial radiographs, no problems with components as reported by patient    Patient Stated Goals  Decrease pain to return to PLOF       Ther-Ex Bike 4 min setting 4 for warm up HEP review with patient able to complete a set of the following with accuracy. Verbal understanding of frequency, set/rep range, inc/dec intensity (when and how) to maintain motion and strength gains: Squat with TRX - 5-10 reps - 3 sets - 1x daily - 2-3x weekly Forward T with Weight - 5-10 reps - 3 sets - 1x daily - 2-3x weekly Single Leg Press - 5-10  reps - 3 sets - 1x daily - 2-3x weekly Single Leg Knee Extension with Weight Machine - 5-10 reps - 3 sets - 1x daily - 2-3x weekly Single Leg Hamstring Curl with Weight Machine - 5-10 reps - 3 sets - 1x daily - 2-3x weekly Runner's Step up with Arms Forward - 5-10 reps - 3 sets - 1x daily   - 2-3x weekly Supine Single Leg Bridge on Box - 5-10 reps - 3 sets - 1x daily - 2-3x weekly Standing Cable Hip Abduction - 5-10 reps - 3 sets - 1x daily - 2-3x weekly    [id number]                      PT Education - 03/10/19 1520    Education provided  Yes    Education Details  HEP, DC recommendations    Person(s) Educated  Patient    Methods  Explanation;Demonstration;Tactile cues;Verbal cues    Comprehension  Verbalized understanding;Returned  demonstration;Verbal cues required;Tactile cues required       PT Short Term Goals - 01/13/19 0953      PT SHORT TERM GOAL #1   Title  Pt will be independent with HEP in order to decrease knee pain and increase strength in order to improve pain-free function at home and work.    Time  4    Period  Weeks    Status  Partially Met        PT Long Term Goals - 03/10/19 1522      PT LONG TERM GOAL #1   Title  Patient will be able to complete 5 STS in 10seconds to demonstrate normal LE strength    Baseline  03/10/19 8.43sec    Time  8    Period  Weeks      PT LONG TERM GOAL #2   Title  Pt will decrease worst pain as reported on NPRS by at least 3 points in order to demonstrate clinically significant reduction in ankle/foot pain.    Baseline  03/10/19 6/10 after being up all day    Time  8    Period  Weeks    Status  Achieved      PT LONG TERM GOAL #3   Title  Pt will increase 10MWT to at least 1.4 m/s in order to demonstrate normal community ambulation, with normalized gait pattern w/o AD to return to PLOF    Baseline  03/10/19 1.49ms no increased pain, no AD    Time  8    Period  Weeks    Status  Achieved      PT LONG TERM GOAL #4   Title  Patient will demonstrate normalized stair negotiation up 2 steps without handrail to safely enter and exit home    Baseline  02/06/19 able to negotiate 4 steps with reciprocol pattern, no handrails    Time  8    Period  Weeks    Status  Achieved      PT LONG TERM GOAL #5   Title  Pt will increase LEFS by at least 9 points in order to demonstrate significant improvement in lower extremity function.    Baseline  03/12/18    Time  8    Period  Weeks    Status  Achieved            Plan - 03/10/19 1642    Clinical Impression Statement  Patient has met all goals to safely d/c PT  at this time. Patine tis able to demonstrate and verbalize understanding of HEP for maintaining strength and motion with ood success and confedience. Pt given PT  clinic contact info should any further questions or concerns arise.    Personal Factors and Comorbidities  Age;Comorbidity 1;Past/Current Experience;Sex    Comorbidities  OA, previous sx on L knee    Examination-Activity Limitations  Bathing;Squat;Lift;Stairs;Bed Mobility;Bend;Locomotion Level;Stand;Carry;Transfers;Sit;Sleep    Examination-Participation Restrictions  Cleaning;Laundry;Community Activity;Driving    Stability/Clinical Decision Making  Evolving/Moderate complexity    Clinical Decision Making  Moderate    Rehab Potential  Good    Clinical Impairments Affecting Rehab Potential  Positive: motivation, age, partial replacement, active; Negative: history of L knee problems    PT Frequency  2x / week    PT Duration  8 weeks    PT Treatment/Interventions  Aquatic Therapy;Electrical Stimulation;Cryotherapy;Iontophoresis 7m/ml Dexamethasone;Moist Heat;DME Instruction;Functional mobility training;Therapeutic activities;Neuromuscular re-education;Dry needling;Passive range of motion;Joint Manipulations;Taping;Manual techniques;Therapeutic exercise;Gait training;Stair training;Patient/family education    PT Next Visit Plan  Continue to work on mobility    PT Home Exercise Plan  Continue HEP quad sets, supine abd, heel slides, knee ext    Consulted and Agree with Plan of Care  Patient       Patient will benefit from skilled therapeutic intervention in order to improve the following deficits and impairments:  Abnormal gait, Decreased activity tolerance, Decreased coordination, Decreased strength, Increased fascial restricitons, Impaired flexibility, Pain, Postural dysfunction, Improper body mechanics, Decreased range of motion, Difficulty walking, Decreased endurance, Decreased mobility, Increased muscle spasms  Visit Diagnosis: Acute pain of left knee  Muscle weakness (generalized)  Chronic pain of left knee  Chronic pain of right knee     Problem List Patient Active Problem List    Diagnosis Date Noted  . OA (osteoarthritis) of knee 12/08/2018  . Asthma, exercise induced 09/20/2014  . Overweight 09/20/2014   CShelton SilvasPT, DPT  CShelton Silvas9/22/2020, 4:45 PM  [facility] [address] [place] [address]Phone: [phone]  Fax:  [phone] Name: Traci HAPPMRN: [id number] of Birth: 4May 21, 1957

## 2019-03-26 ENCOUNTER — Other Ambulatory Visit: Payer: Self-pay

## 2019-03-26 DIAGNOSIS — Z20822 Contact with and (suspected) exposure to covid-19: Secondary | ICD-10-CM

## 2019-03-31 ENCOUNTER — Telehealth: Payer: Self-pay

## 2019-03-31 NOTE — Telephone Encounter (Signed)
Outgoing call to Patient.  Related to Patient tha she will need to retest for covid-19 testing. Voiced understanding.

## 2019-04-01 ENCOUNTER — Other Ambulatory Visit: Payer: Self-pay

## 2019-04-01 DIAGNOSIS — Z20822 Contact with and (suspected) exposure to covid-19: Secondary | ICD-10-CM

## 2019-04-02 LAB — NOVEL CORONAVIRUS, NAA: SARS-CoV-2, NAA: NOT DETECTED

## 2019-10-20 NOTE — Patient Instructions (Addendum)
DUE TO COVID-19 ONLY ONE VISITOR IS ALLOWED TO COME WITH YOU AND STAY IN THE WAITING ROOM ONLY DURING PRE OP AND PROCEDURE DAY OF SURGERY. THE 1 VISITOR MAY VISIT WITH YOU AFTER SURGERY IN YOUR PRIVATE ROOM DURING VISITING HOURS ONLY!  YOU NEED TO HAVE A COVID 19 TEST ON 10-29-19 @ 2:45 PM, THIS TEST MUST BE DONE BEFORE SURGERY, COME  Traci Drake, Traci Drake , Traci Drake.  ([place]) ONCE YOUR COVID TEST IS COMPLETED, PLEASE BEGIN THE QUARANTINE INSTRUCTIONS AS OUTLINED IN YOUR HANDOUT.                Traci Drake  10/20/2019   Your procedure is scheduled on: 11-02-19    Report to [facility] Main  Entrance    Report to Admitting at  12:20 PM     Call this number if you have problems the morning of surgery [phone]    Remember: AFTER MIDNIGHT THE NIGHT PRIOR TO SURGERY. NOTHING BY MOUTH EXCEPT CLEAR LIQUIDS UNTIL 8:50 AM AFTER 8:50 AM, NOTHING UNTIL AFTER SURGERY.   CLEAR LIQUID DIET   Foods Allowed                                                                     Foods Excluded  Coffee and tea, regular and decaf                             liquids that you cannot  Plain Jell-O any favor except red or purple                                           see through such as: Fruit ices (not with fruit pulp)                                     milk, soups, orange juice  Iced Popsicles                                    All solid food Carbonated beverages, regular and diet                                    Cranberry, grape and apple juices Sports drinks like Gatorade Lightly seasoned clear broth or consume(fat free) Sugar, honey syrup   _____________________________________________________________________     Take these medicines the morning of surgery with A SIP OF WATER: GABAPENTIN (NEURONTIN), OMEPRAZOLE (PRILOSEC)   BRUSH YOUR TEETH MORNING OF SURGERY AND RINSE YOUR MOUTH OUT, NO CHEWING GUM CANDY OR MINTS.                    You may not have any metal on your body including hair pins and              piercings     Do not  wear jewelry, make-up, lotions, powders or perfumes, deodorant              Do not wear nail polish on your fingernails.  Do not shave  48 hours prior to surgery.      Do not bring valuables to the hospital.  IS NOT             RESPONSIBLE   FOR VALUABLES.  Contacts, dentures or bridgework may not be worn into surgery.     Patients discharged the day of surgery will not be allowed to drive home. IF YOU ARE HAVING SURGERY AND GOING HOME THE SAME DAY, YOU MUST HAVE AN ADULT TO DRIVE YOU HOME AND BE WITH YOU FOR 24 HOURS. YOU MAY GO HOME BY TAXI OR UBER OR ORTHERWISE, BUT AN ADULT MUST ACCOMPANY YOU HOME AND STAY WITH YOU FOR 24 HOURS.  Name and phone number of your driver:Traci Drake [phone]  Special Instructions: N/A              Please read over the following fact sheets you were given: _____________________________________________________________________             [facility] - Preparing for Surgery Before surgery, you can play an important role.  Because skin is not sterile, your skin needs to be as free of germs as possible.  You can reduce the number of germs on your skin by washing with CHG (chlorahexidine gluconate) soap before surgery.  CHG is an antiseptic cleaner which kills germs and bonds with the skin to continue killing germs even after washing. Please DO NOT use if you have an allergy to CHG or antibacterial soaps.  If your skin becomes reddened/irritated stop using the CHG and inform your nurse when you arrive at Short Stay. Do not shave (including legs and underarms) for at least 48 hours prior to the first CHG shower.  You may shave your face/neck. Please follow these instructions carefully:  1.  Shower with CHG Soap the night before surgery and the  morning of Surgery.  2.  If you choose to wash your hair, wash your hair first as usual with your   normal  shampoo.  3.  After you shampoo, rinse your hair and body thoroughly to remove the  shampoo.                           4.  Use CHG as you would any other liquid soap.  You can apply chg directly  to the skin and wash                       Gently with a scrungie or clean washcloth.  5.  Apply the CHG Soap to your body ONLY FROM THE NECK DOWN.   Do not use on face/ open                           Wound or open sores. Avoid contact with eyes, ears mouth and genitals (private parts).                       Wash face,  Genitals (private parts) with your normal soap.             6.  Wash thoroughly, paying special attention to the area where your surgery  will be performed.  7.  Thoroughly rinse  your body with warm water from the neck down.  8.  DO NOT shower/wash with your normal soap after using and rinsing off  the CHG Soap.                9.  Pat yourself dry with a clean towel.            10.  Wear clean pajamas.            11.  Place clean sheets on your bed the night of your first shower and do not  sleep with pets. Day of Surgery : Do not apply any lotions/deodorants the morning of surgery.  Please wear clean clothes to the hospital/surgery center.  FAILURE TO FOLLOW THESE INSTRUCTIONS MAY RESULT IN THE CANCELLATION OF YOUR SURGERY PATIENT SIGNATURE_________________________________  NURSE SIGNATURE__________________________________  ________________________________________________________________________   Traci Drake  An incentive spirometer is a tool that can help keep your lungs clear and active. This tool measures how well you are filling your lungs with each breath. Taking long deep breaths may help reverse or decrease the chance of developing breathing (pulmonary) problems (especially infection) following:  A long period of time when you are unable to move or be active. BEFORE THE PROCEDURE   If the spirometer includes an indicator to show your best effort, your  nurse or respiratory therapist will set it to a desired goal.  If possible, sit up straight or lean slightly forward. Try not to slouch.  Hold the incentive spirometer in an upright position. INSTRUCTIONS FOR USE  1. Sit on the edge of your bed if possible, or sit up as far as you can in bed or on a chair. 2. Hold the incentive spirometer in an upright position. 3. Breathe out normally. 4. Place the mouthpiece in your mouth and seal your lips tightly around it. 5. Breathe in slowly and as deeply as possible, raising the piston or the ball toward the top of the column. 6. Hold your breath for 3-5 seconds or for as long as possible. Allow the piston or ball to fall to the bottom of the column. 7. Remove the mouthpiece from your mouth and breathe out normally. 8. Rest for a few seconds and repeat Steps 1 through 7 at least 10 times every 1-2 hours when you are awake. Take your time and take a few normal breaths between deep breaths. 9. The spirometer may include an indicator to show your best effort. Use the indicator as a goal to work toward during each repetition. 10. After each set of 10 deep breaths, practice coughing to be sure your lungs are clear. If you have an incision (the cut made at the time of surgery), support your incision when coughing by placing a pillow or rolled up towels firmly against it. Once you are able to get out of bed, walk around indoors and cough well. You may stop using the incentive spirometer when instructed by your caregiver.  RISKS AND COMPLICATIONS  Take your time so you do not get dizzy or light-headed.  If you are in pain, you may need to take or ask for pain medication before doing incentive spirometry. It is harder to take a deep breath if you are having pain. AFTER USE  Rest and breathe slowly and easily.  It can be helpful to keep track of a log of your progress. Your caregiver can provide you with a simple table to help with this. If you are using the  spirometer at  home, follow these instructions: [place] IF:   You are having difficultly using the spirometer.  You have trouble using the spirometer as often as instructed.  Your pain medication is not giving enough relief while using the spirometer.  You develop fever of 100.5 F (38.1 C) or higher. SEEK IMMEDIATE MEDICAL CARE IF:   You cough up bloody sputum that had not been present before.  You develop fever of 102 F (38.9 C) or greater.  You develop worsening pain at or near the incision site. MAKE SURE YOU:   Understand these instructions.  Will watch your condition.  Will get help right away if you are not doing well or get worse. Document Released: 10/15/2006 Document Revised: 08/27/2011 Document Reviewed: 12/16/2006 [facility] Patient Information 2014 [place], [place].   ________________________________________________________________________

## 2019-10-20 NOTE — Progress Notes (Addendum)
PCP - Maryjean Morn, MD (resident) at [facility] Cardiologist -   Chest x-ray -  EKG -  Stress Test -  ECHO -  Cardiac Cath -   Sleep Study -  CPAP -   Fasting Blood Sugar -  Checks Blood Sugar _____ times a day  Blood Thinner Instructions: Aspirin Instructions: Last Dose:  Anesthesia review:   Patient denies shortness of breath, fever, cough and chest pain at PAT appointment   Patient verbalized understanding of instructions that were given to them at the PAT appointment. Patient was also instructed that they will need to review over the PAT instructions again at home before surgery.

## 2019-10-21 ENCOUNTER — Other Ambulatory Visit: Payer: Self-pay

## 2019-10-21 ENCOUNTER — Encounter (HOSPITAL_COMMUNITY)
Admission: RE | Admit: 2019-10-21 | Discharge: 2019-10-21 | Disposition: A | Discharge status: Home / Self Care | Source: Ambulatory Visit | Attending: Orthopedic Surgery | Admitting: Orthopedic Surgery

## 2019-10-21 ENCOUNTER — Encounter (HOSPITAL_COMMUNITY): Payer: Self-pay

## 2019-10-21 DIAGNOSIS — Z01812 Encounter for preprocedural laboratory examination: Secondary | ICD-10-CM | POA: Diagnosis present

## 2019-10-21 LAB — CBC
HCT: 44.6 % (ref 36.0–46.0)
Hemoglobin: 14.5 g/dL (ref 12.0–15.0)
MCH: 30.9 pg (ref 26.0–34.0)
MCHC: 32.5 g/dL (ref 30.0–36.0)
MCV: 94.9 fL (ref 80.0–100.0)
Platelets: 270 10*3/uL (ref 150–400)
RBC: 4.7 MIL/uL (ref 3.87–5.11)
RDW: 12.2 % (ref 11.5–15.5)
WBC: 5.2 10*3/uL (ref 4.0–10.5)
nRBC: 0 % (ref 0.0–0.2)

## 2019-10-21 LAB — SURGICAL PCR SCREEN
MRSA, PCR: NEGATIVE
Staphylococcus aureus: NEGATIVE

## 2019-10-23 ENCOUNTER — Ambulatory Visit (LOCAL_COMMUNITY_HEALTH_CENTER): Payer: Self-pay

## 2019-10-23 ENCOUNTER — Other Ambulatory Visit: Payer: Self-pay

## 2019-10-23 DIAGNOSIS — Z111 Encounter for screening for respiratory tuberculosis: Secondary | ICD-10-CM

## 2019-10-26 ENCOUNTER — Ambulatory Visit (LOCAL_COMMUNITY_HEALTH_CENTER): Payer: Self-pay

## 2019-10-26 ENCOUNTER — Other Ambulatory Visit: Payer: Self-pay

## 2019-10-26 DIAGNOSIS — Z111 Encounter for screening for respiratory tuberculosis: Secondary | ICD-10-CM

## 2019-10-26 LAB — TB SKIN TEST
Induration: 0 mm
TB Skin Test: NEGATIVE

## 2019-10-29 ENCOUNTER — Other Ambulatory Visit (HOSPITAL_COMMUNITY)
Admission: RE | Admit: 2019-10-29 | Discharge: 2019-10-29 | Disposition: A | Discharge status: Home / Self Care | Source: Ambulatory Visit | Attending: Orthopedic Surgery | Admitting: Orthopedic Surgery

## 2019-10-29 DIAGNOSIS — Z20822 Contact with and (suspected) exposure to covid-19: Secondary | ICD-10-CM | POA: Diagnosis not present

## 2019-10-29 DIAGNOSIS — Z01812 Encounter for preprocedural laboratory examination: Secondary | ICD-10-CM | POA: Diagnosis not present

## 2019-10-29 LAB — SARS CORONAVIRUS 2 (TAT 6-24 HRS): SARS Coronavirus 2: NEGATIVE

## 2019-11-01 NOTE — H&P (Signed)
CC- Traci Drake is a 64 y.o. female who presents with left knee stiffness.  HPI- . Knee Pain: Patient presents with stiffness involving the  left knee. Onset of the symptoms was several months ago. Inciting event: She had a left Total Knee Arthroplasty on 6/20 and had done well initially but has had progressive stiffness and is having a harder time doing activities she desires. She presents now for closed manipulation. Current symptoms include stiffness. Pain is aggravated by going up and down stairs.  Patient has had prior knee problems. Evaluation to date: plain films: normal. Treatment to date: PT which was somewhat effective.  Past Medical History:  Diagnosis Date  . Asthma    seasonal  . Complication of anesthesia    needed albumin in PACU. and had vertigo  . Decreased libido without sexual dysfunction   . Menopause     Past Surgical History:  Procedure Laterality Date  . BREAST BIOPSY  2003  . KNEE ARTHROPLASTY Right 11/2016   Partial   . KNEE ARTHROSCOPY Left    6-8 years ago  . TOTAL KNEE ARTHROPLASTY Left 12/08/2018   Procedure: TOTAL KNEE ARTHROPLASTY;  Surgeon: Ollen Gross, MD;  Location: [facility];  Service: Orthopedics;  Laterality: Left;    Prior to Admission medications   Medication Sig Start Date End Date Taking? Authorizing Provider  albuterol (VENTOLIN HFA) 108 (90 Base) MCG/ACT inhaler Inhale 2 puffs into the lungs every 6 (six) hours as needed for wheezing or shortness of breath.   Yes [provider]  gabapentin (NEURONTIN) 300 MG capsule Take 300 mg by mouth 2 (two) times daily.    Yes [provider]  meloxicam (MOBIC) 15 MG tablet Take 15 mg by mouth daily as needed for pain.   Yes [provider]  omeprazole (PRILOSEC) 20 MG capsule Take 20 mg by mouth 2 (two) times daily. 09/03/19  Yes [provider]  traMADol (ULTRAM) 50 MG tablet Take 1-2 tablets (50-100 mg total) by mouth every 6 (six) hours as needed for moderate  pain. 12/09/18  Yes Edmisten, Kristie L, PA  methocarbamol (ROBAXIN) 500 MG tablet Take 1 tablet (500 mg total) by mouth every 6 (six) hours as needed for muscle spasms. Patient not taking: Reported on 10/09/2019 12/09/18   Edmisten, Danford Bad L, PA  oxyCODONE (OXY IR/ROXICODONE) 5 MG immediate release tablet Take 1-2 tablets (5-10 mg total) by mouth every 6 (six) hours as needed for severe pain. Patient not taking: Reported on 10/09/2019 12/09/18   Edmisten, Danford Bad L, PA   KNEE EXAM antalgic gait, no warmth or effusion, reduced range of motion (5-90), negative drawer sign, collateral ligaments intact  Physical Examination: General appearance - alert, well appearing, and in no distress Mental status - alert, oriented to person, place, and time Chest - clear to auscultation, no wheezes, rales or rhonchi, symmetric air entry Heart - normal rate, regular rhythm, normal S1, S2, no murmurs, rubs, clicks or gallops Abdomen - soft, nontender, nondistended, no masses or organomegaly Neurological - alert, oriented, normal speech, no focal findings or movement disorder noted   Asessment/Plan--- Left knee arthrofibrosis- - Plan left knee closed manipulation. Procedure risks and potential comps discussed with patient who elects to proceed. Goals are decreased pain and increased function with a high likelihood of achieving both

## 2019-11-02 ENCOUNTER — Ambulatory Visit (HOSPITAL_COMMUNITY): Admitting: Anesthesiology

## 2019-11-02 ENCOUNTER — Encounter (HOSPITAL_COMMUNITY)
Admission: RE | Disposition: A | Discharge status: Home / Self Care | Payer: Self-pay | Source: Ambulatory Visit | Attending: Orthopedic Surgery

## 2019-11-02 ENCOUNTER — Other Ambulatory Visit: Payer: Self-pay

## 2019-11-02 ENCOUNTER — Telehealth (HOSPITAL_COMMUNITY): Payer: Self-pay | Admitting: *Deleted

## 2019-11-02 ENCOUNTER — Encounter (HOSPITAL_COMMUNITY): Payer: Self-pay | Admitting: Orthopedic Surgery

## 2019-11-02 ENCOUNTER — Ambulatory Visit (HOSPITAL_COMMUNITY)
Admission: RE | Admit: 2019-11-02 | Discharge: 2019-11-02 | Disposition: A | Discharge status: Home / Self Care | Source: Ambulatory Visit | Attending: Orthopedic Surgery | Admitting: Orthopedic Surgery

## 2019-11-02 DIAGNOSIS — Z79899 Other long term (current) drug therapy: Secondary | ICD-10-CM | POA: Insufficient documentation

## 2019-11-02 DIAGNOSIS — J45998 Other asthma: Secondary | ICD-10-CM | POA: Diagnosis not present

## 2019-11-02 DIAGNOSIS — Z96653 Presence of artificial knee joint, bilateral: Secondary | ICD-10-CM | POA: Insufficient documentation

## 2019-11-02 DIAGNOSIS — M24662 Ankylosis, left knee: Secondary | ICD-10-CM | POA: Diagnosis present

## 2019-11-02 DIAGNOSIS — T8482XA Fibrosis due to internal orthopedic prosthetic devices, implants and grafts, initial encounter: Secondary | ICD-10-CM | POA: Diagnosis present

## 2019-11-02 HISTORY — PX: KNEE CLOSED REDUCTION: SHX995

## 2019-11-02 SURGERY — MANIPULATION, KNEE, CLOSED
Anesthesia: General | Site: Knee | Laterality: Left

## 2019-11-02 MED ORDER — ONDANSETRON HCL 4 MG/2ML IJ SOLN
INTRAMUSCULAR | Status: AC
  Filled 2019-11-02: qty 2

## 2019-11-02 MED ORDER — SODIUM CHLORIDE 0.9 % IV SOLN: INTRAVENOUS | Status: DC

## 2019-11-02 MED ORDER — PROPOFOL 10 MG/ML IV BOLUS
INTRAVENOUS | Status: DC | PRN
  Administered 2019-11-02: 200 mg via INTRAVENOUS

## 2019-11-02 MED ORDER — LIDOCAINE HCL (CARDIAC) PF 50 MG/5ML IV SOSY
PREFILLED_SYRINGE | INTRAVENOUS | Status: DC | PRN
  Administered 2019-11-02: 100 mg via INTRAVENOUS

## 2019-11-02 MED ORDER — LACTATED RINGERS IV SOLN: INTRAVENOUS | Status: DC

## 2019-11-02 MED ORDER — CHLORHEXIDINE GLUCONATE 4 % EX LIQD: 60.0000 mL | Freq: Once | CUTANEOUS | Status: DC

## 2019-11-02 MED ORDER — TRAMADOL HCL 50 MG PO TABS: 50.0000 mg | ORAL_TABLET | Freq: Four times a day (QID) | ORAL | 0 refills | Status: AC | PRN

## 2019-11-02 MED ORDER — ONDANSETRON HCL 4 MG/2ML IJ SOLN
INTRAMUSCULAR | Status: DC | PRN
  Administered 2019-11-02: 4 mg via INTRAVENOUS

## 2019-11-02 MED ORDER — KETOROLAC TROMETHAMINE 30 MG/ML IJ SOLN: 30.0000 mg | Freq: Once | INTRAMUSCULAR | Status: DC | PRN

## 2019-11-02 MED ORDER — FENTANYL CITRATE (PF) 100 MCG/2ML IJ SOLN: 25.0000 ug | INTRAMUSCULAR | Status: DC | PRN

## 2019-11-02 MED ORDER — LIDOCAINE 2% (20 MG/ML) 5 ML SYRINGE
INTRAMUSCULAR | Status: AC
  Filled 2019-11-02: qty 5

## 2019-11-02 MED ORDER — POVIDONE-IODINE 10 % EX SWAB
2.0000 "application " | Freq: Once | CUTANEOUS | Status: AC
  Administered 2019-11-02: 2 via TOPICAL

## 2019-11-02 MED ORDER — CHLORHEXIDINE GLUCONATE 0.12 % MT SOLN
15.0000 mL | Freq: Once | OROMUCOSAL | Status: AC
  Administered 2019-11-02: 15 mL via OROMUCOSAL

## 2019-11-02 SURGICAL SUPPLY — 17 items
BNDG ADH 1X3 SHEER STRL LF (GAUZE/BANDAGES/DRESSINGS) IMPLANT
COVER SURGICAL LIGHT HANDLE (MISCELLANEOUS) ×3 IMPLANT
COVER WAND RF STERILE (DRAPES) IMPLANT
GAUZE SPONGE 4X4 12PLY STRL (GAUZE/BANDAGES/DRESSINGS) IMPLANT
GLOVE BIO SURGEON STRL SZ8 (GLOVE) ×3 IMPLANT
GLOVE BIOGEL PI IND STRL 7.0 (GLOVE) ×1 IMPLANT
GLOVE BIOGEL PI IND STRL 8 (GLOVE) ×1 IMPLANT
GLOVE BIOGEL PI INDICATOR 7.0 (GLOVE) ×2
GLOVE BIOGEL PI INDICATOR 8 (GLOVE) ×2
GLOVE SURG SS PI 7.0 STRL IVOR (GLOVE) ×3 IMPLANT
GOWN STRL REUS W/TWL LRG LVL3 (GOWN DISPOSABLE) ×3 IMPLANT
KIT TURNOVER KIT A (KITS) IMPLANT
NDL SAFETY ECLIPSE 18X1.5 (NEEDLE) IMPLANT
NEEDLE HYPO 18GX1.5 SHARP (NEEDLE)
PENCIL SMOKE EVACUATOR (MISCELLANEOUS) IMPLANT
SWABSTICK PVP IODINE (MISCELLANEOUS) ×3 IMPLANT
SYR CONTROL 10ML LL (SYRINGE) IMPLANT

## 2019-11-02 NOTE — Discharge Instructions (Signed)
Resume your exercises tomorrow  I was able to flex your knee 135 degrees

## 2019-11-02 NOTE — Anesthesia Preprocedure Evaluation (Signed)
Anesthesia Evaluation  Patient identified by MRN, date of birth, ID band Patient awake    Reviewed: Allergy & Precautions, NPO status , Patient's Chart, lab work & pertinent test results  Airway Mallampati: II  TM Distance: >3 FB Neck ROM: Full    Dental no notable dental hx.    Pulmonary neg pulmonary ROS,    Pulmonary exam normal breath sounds clear to auscultation       Cardiovascular negative cardio ROS Normal cardiovascular exam Rhythm:Regular Rate:Normal     Neuro/Psych negative neurological ROS  negative psych ROS   GI/Hepatic negative GI ROS, Neg liver ROS,   Endo/Other  negative endocrine ROS  Renal/GU negative Renal ROS  negative genitourinary   Musculoskeletal negative musculoskeletal ROS (+)   Abdominal   Peds negative pediatric ROS (+)  Hematology negative hematology ROS (+)   Anesthesia Other Findings   Reproductive/Obstetrics negative OB ROS                             Anesthesia Physical Anesthesia Plan  ASA: II  Anesthesia Plan: General   Post-op Pain Management:    Induction: Intravenous  PONV Risk Score and Plan: 3 and Ondansetron  Airway Management Planned: Mask and LMA  Additional Equipment:   Intra-op Plan:   Post-operative Plan: Extubation in OR  Informed Consent: I have reviewed the patients History and Physical, chart, labs and discussed the procedure including the risks, benefits and alternatives for the proposed anesthesia with the patient or authorized representative who has indicated his/her understanding and acceptance.     Dental advisory given  Plan Discussed with: CRNA and Surgeon  Anesthesia Plan Comments:         Anesthesia Quick Evaluation

## 2019-11-02 NOTE — Op Note (Signed)
  OPERATIVE REPORT   PREOPERATIVE DIAGNOSIS: Arthrofibrosis, Left  knee.   POSTOPERATIVE DIAGNOSIS: Arthrofibrosis, Left knee.   PROCEDURE:  Left  knee closed manipulation.   SURGEON: Ollen Gross, MD   ASSISTANT: None.   ANESTHESIA: General.   COMPLICATIONS: None.   CONDITION: Stable to Recovery.   Pre-manipulation range of motion is 0-95.  Post-manipulation range of  Motion is 0-135  PROCEDURE IN DETAIL: After successful administration of general  anesthetic, exam under anesthesia was performed showing range of motion  0-95 degrees. I then placed my chest against the proximal tibia,  flexing the knee with audible lysis of adhesions. I was easily able to  get the knee flexed to 135  degrees. I then put the knee back in extension with maintenance of  full  Extension.The patient was subsequently awakened and transported to Recovery in  stable condition.

## 2019-11-02 NOTE — Anesthesia Postprocedure Evaluation (Signed)
Anesthesia Post Note  Patient: Traci Drake  Procedure(s) Performed: CLOSED MANIPULATION KNEE (Left Knee)     Patient location during evaluation: PACU Anesthesia Type: General Level of consciousness: awake and alert Pain management: pain level controlled Vital Signs Assessment: post-procedure vital signs reviewed and stable Respiratory status: spontaneous breathing, nonlabored ventilation, respiratory function stable and patient connected to nasal cannula oxygen Cardiovascular status: blood pressure returned to baseline and stable Postop Assessment: no apparent nausea or vomiting Anesthetic complications: no    Last Vitals:  Vitals:   11/02/19 1353 11/02/19 1400  BP: 116/70 114/73  Pulse: 79 76  Resp: 13 16  Temp: 36.8 C   SpO2: 100% 100%    Last Pain:  Vitals:   11/02/19 1353  TempSrc:   PainSc: Asleep                 Hetvi Shawhan S

## 2019-11-02 NOTE — Interval H&P Note (Signed)
History and Physical Interval Note:  11/02/2019 12:29 PM  Traci Drake  has presented today for surgery, with the diagnosis of arthrofibrosis left knee.  The various methods of treatment have been discussed with the patient and family. After consideration of risks, benefits and other options for treatment, the patient has consented to  Procedure(s) with comments: CLOSED MANIPULATION KNEE (Left) - as a surgical intervention.  The patient's history has been reviewed, patient examined, no change in status, stable for surgery.  I have reviewed the patient's chart and labs.  Questions were answered to the patient's satisfaction.     Homero Fellers [place]

## 2019-11-02 NOTE — Transfer of Care (Signed)
Immediate Anesthesia Transfer of Care Note  Patient: Traci Drake  Procedure(s) Performed: CLOSED MANIPULATION KNEE (Left Knee)  Patient Location: PACU  Anesthesia Type:General  Level of Consciousness: awake, alert  and oriented  Airway & Oxygen Therapy: Patient Spontanous Breathing and Patient connected to face mask oxygen  Post-op Assessment: Report given to RN, Post -op Vital signs reviewed and stable and Patient moving all extremities X 4  Post vital signs: Reviewed and stable  Last Vitals:  Vitals Value Taken Time  BP 116/70 11/02/19 1353  Temp    Pulse 81 11/02/19 1354  Resp 16 11/02/19 1354  SpO2 100 % 11/02/19 1354  Vitals shown include unvalidated device data.  Last Pain:  Vitals:   11/02/19 1124  TempSrc: Oral         Complications: No apparent anesthesia complications

## 2019-11-04 ENCOUNTER — Other Ambulatory Visit: Payer: Self-pay

## 2019-11-04 ENCOUNTER — Encounter: Payer: Self-pay | Admitting: Physical Therapy

## 2019-11-04 ENCOUNTER — Ambulatory Visit: Attending: Orthopedic Surgery | Admitting: Physical Therapy

## 2019-11-04 DIAGNOSIS — M25562 Pain in left knee: Secondary | ICD-10-CM | POA: Diagnosis present

## 2019-11-04 DIAGNOSIS — M6281 Muscle weakness (generalized): Secondary | ICD-10-CM | POA: Insufficient documentation

## 2019-11-04 DIAGNOSIS — M25662 Stiffness of left knee, not elsewhere classified: Secondary | ICD-10-CM | POA: Diagnosis not present

## 2019-11-04 DIAGNOSIS — G8929 Other chronic pain: Secondary | ICD-10-CM | POA: Diagnosis present

## 2019-11-04 NOTE — Therapy (Signed)
[facility] [address], [place], [address] Phone: [phone]   Fax:  [phone]  Physical Therapy Treatment  Patient Details  Name: Traci Drake MRN: [id number] Date of Birth: [birth year] No data recorded  Encounter Date: 11/04/2019  PT End of Session - 11/04/19 1520    Visit Number  1    Number of Visits  17    Date for PT Re-Evaluation  01/01/20    PT Start Time  0945    PT Stop Time  1030    PT Time Calculation (min)  45 min    Activity Tolerance  Patient tolerated treatment well;No increased pain    Behavior During Therapy  WFL for tasks assessed/performed       Past Medical History:  Diagnosis Date  . Asthma    seasonal  . Complication of anesthesia    needed albumin in PACU. and had vertigo  . Decreased libido without sexual dysfunction   . Menopause     Past Surgical History:  Procedure Laterality Date  . BREAST BIOPSY  2003  . KNEE ARTHROPLASTY Right 11/2016   Partial   . KNEE ARTHROSCOPY Left    6-8 years ago  . KNEE CLOSED REDUCTION Left 11/02/2019   Procedure: CLOSED MANIPULATION KNEE;  Surgeon: Gaynelle Arabian, MD;  Location: [facility];  Service: Orthopedics;  Laterality: Left;  5min  . 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  Subjective Assessment - 11/04/19 0949    Pertinent History  Patient s/p L TKA 12/08/18 with successful rehab here, discharge with full ROM and strength 03/10/19. Reports in Jan 2021 she felt like her hamstring started getting very tight and she lost motion of the knee. She returned to Dr. Maureen Ralphs, reporting she only had about 95d of flex and he completed manipluation under anesthesia 11/02/19, with 135d flexion gained. She reports that she felt like she had a lot of motion following  procedure, but that it has stiffened up since then. She is still working as a Programmer, systems full time, and reports she has not had a consistent exercise regimen. Reports difficulty with step negotation over things in the yard (as she likes to garden), squatting and bending. Reports minimal pain, other than 2/10 pain today following procedure Monday. Pt denies N/V, B&B changes, unexplained weight fluctuation, saddle paresthesia, fever, night sweats, or unrelenting night pain at this time.    Limitations  House hold activities;Standing;Lifting    How long can you sit comfortably?  unlmited    How long can you stand comfortably?  unlimited    How long can you walk comfortably?  unlimited    Diagnostic tests  none since TKA    Patient Stated Goals  Increased motion and strength    Currently in Pain?  Yes    Pain Score  2     Pain Location  Knee    Pain Orientation  Left    Pain Descriptors / Indicators  Dull;Aching    Pain Radiating Towards  ache in hamstring    Pain Onset  More than a month ago    Pain Frequency  Intermittent    Aggravating Factors   squatting, bending,    Pain Relieving Factors  rest    Effect of Pain on Daily Activities  unable to complete yardwork    Multiple Pain Sites  No            OBJECTIVE  Mental Status Patient is oriented to person, place and time.  Recent memory is intact.  Remote memory is intact.  Attention span and concentration are intact.  Expressive speech is intact.  Patient's fund of knowledge is within normal limits for educational level.  SENSATION: Grossly intact to light touch bilateral LEs as determined by testing dermatomes L2-S2 Proprioception and hot/cold testing deferred on this date   MUSCULOSKELETAL: Tremor: None Bulk: Normal Tone: Normal No visible step-off along spinal column  Posture Lumbar lordosis: increased Iliac crest height: equal bilaterally Lumbar lateral shift: negative Lower crossed syndrome (tight hip flexors  and erector spinae; weak gluts and abs): positive   Gait Decreased L TKE in terminal stance, slight hip flex maintained bilat throughout gait cycle   Palpation  TTP at joint d/t some miniscule swelling following procedure 5/17; normal joint mobility   Strength (out of 5) R/L 4+/5 Hip flexion 4-*/5 Hip ER 4/5 Hip IR 4+/5 Hip abduction 5/5 Hip adduction 4/5 Hip extension 5/5 Knee extension 4/5 Knee flexion 5/5 Ankle dorsiflexion 5/5 Ankle plantarflexion Hamstring curl 1RM R:35#  L: 25# *Indicates pain   AROM (degrees) All hip and lumbar spine motions WNL with some increased hip flexor tension at end range hip ext bilat  Knee flex R: 135 L: 105 Knee ext R: 0d L: -4d *Indicates pain   PROM (degrees) WNL for hip, ankle and lumbar motions, difficulty maintaining hip ext with knee flex (elys) Knee flex R: 135 L: 119 Knee ext R: L: 0d 0d  Muscle Length Hamstrings:WNL bilat Ely: Positive bilat R>L Thomas: Positive bilat R>L Ober:    Ther-Ex Education on therex for increased hamstring strength to aid in active knee flex, as hamstring has adequate length, but decreased 1RM and MMT, and increased PROM>AROM. Patient verbalized understanding of education and purpose of therex. Patient able to demonstrate a set of the following therex with accuracy following min cuing and verbalizes understanding of parameters for all.  Access Code: [email or web address] Exercises .Single Leg Bridge - 1 x daily - 3 x weekly - 3 sets - 10 reps .Standard Lunge - 1 x daily - 3 x weekly - 3 sets - 5-10 reps .Seated Knee Flexion Slide - 3 x daily - 7 x weekly - 3 sets - 30sec hold .Standing Hip Flexor Stretch - 3 x daily - 7 x weekly - 3 sets - 30sec hold     Pt will increase LEFS by at least 9 points in order to demonstrate significant improvement in lower extremity function.      Pt will decrease worst pain as reported on NPRS by at least 3 points in order to demonstrate clinically significant reduction in  ankle/foot pain.   Pt will increase strength of  by at least 1/2 MMT grade in order to demonstrate improvement in strength and function   PT Education - 11/04/19 0956    Education provided  Yes    Education Details  Patient was educated on diagnosis, anatomy and pathology involved, prognosis, role of PT, and was given an HEP, demonstrating exercise with proper form following verbal and tactile cues, and was given a paper hand out to continue exercise at home. Pt was educated on and agreed to plan of care.    Person(s) Educated  Patient    Methods  Explanation;Demonstration;Verbal cues;Handout    Comprehension  Verbalized  understanding;Returned demonstration;Verbal cues required       PT Short Term Goals - 11/05/19 1048      PT SHORT TERM GOAL #1   Title  Pt will be independent with HEP in order to decrease ankle pain and increase strength in order to improve pain-free function at home and work.    Baseline  11/05/19 HEP given    Time  4    Period  Weeks    Status  New      PT SHORT TERM GOAL #2   Title  Patient will demonstrate PROM WNL in order to demonstrate adequate joint motion for ADLs    Baseline  11/05/19 0d - 119d    Time  4    Period  Weeks    Status  New        PT Long Term Goals - 11/05/19 1048      PT LONG TERM GOAL #1   Title  Patient will increase FOTO score to 67 to demonstrate predicted increase in functional mobility to complete ADLs    Baseline  11/05/19 60    Time  8    Period  Weeks      PT LONG TERM GOAL #2   Title  Patient will demonstrate 1RM of L hamstring curl of 35# to demonstrate symmetry of RLE and increased knee flex strength    Baseline  11/05/19 25#    Time  8    Period  Weeks    Status  New      PT LONG TERM GOAL #3   Title  Patient will demonstrate knee AROM WNL in order to complete yard work and stair negotiation    Baseline  11/05/19 -4d - 105d    Time  8    Period  Weeks    Status  New            Plan - 11/05/19 1022     Clinical Impression Statement  Pt is a 64 y/o female presenting s/p L knee manipulation under anesthesia 11/02/19. Patient seen and d/c from this clinic Sept 2020 following rehab for this TKA with full ROM, since has had increased stiffness in the knee. Impairments include decreased knee flex PROM and AROM, decreased knee flexor and hip ext strength, abnormal posture, abnormal gait, and pain. Activity limitations in sqatting, obstacle negotiation, bending/stooping; inhibiting full participation in gardening and ADLs. Pt would benefit from skilled PT to aforementioned impairments in order to return to PLOF.    Personal Factors and Comorbidities  Age;Comorbidity 1;Past/Current Experience;Sex;Time since onset of injury/illness/exacerbation    Comorbidities  OA, previous sx on L knee    Examination-Activity Limitations  Bathing;Squat;Lift;Stairs;Bed Mobility;Bend;Locomotion Level;Stand;Carry;Transfers;Sit;Sleep    Examination-Participation Restrictions  Cleaning;Laundry;Community Activity;Driving    Stability/Clinical Decision Making  Evolving/Moderate complexity    Clinical Decision Making  Moderate    Rehab Potential  Good    Clinical Impairments Affecting Rehab Potential  Positive: motivation, age, partial replacement, active; Negative: history of L knee problems    PT Frequency  2x / week    PT Duration  8 weeks    PT Treatment/Interventions  Aquatic Therapy;Electrical Stimulation;Cryotherapy;Iontophoresis 4mg /ml Dexamethasone;Moist Heat;DME Instruction;Functional mobility training;Therapeutic activities;Neuromuscular re-education;Dry needling;Passive range of motion;Joint Manipulations;Taping;Manual techniques;Therapeutic exercise;Gait training;Stair training;Patient/family education    PT Next Visit Plan  balance assessment, 5xSTS/10MWT, HEP review, hamstring strengthening    PT Home Exercise Plan  SL bridge, lunge, knee flex stretch, hip flexor stretch    Consulted and Agree with Plan  of Care   Patient       Patient will benefit from skilled therapeutic intervention in order to improve the following deficits and impairments:  Abnormal gait, Decreased activity tolerance, Decreased coordination, Decreased strength, Increased fascial restricitons, Impaired flexibility, Pain, Postural dysfunction, Improper body mechanics, Decreased range of motion, Difficulty walking, Decreased endurance, Decreased mobility, Increased muscle spasms  Visit Diagnosis: Stiffness of left knee, not elsewhere classified  Muscle weakness (generalized)  Chronic pain of left knee     Problem List Patient Active Problem List   Diagnosis Date Noted  . Arthrofibrosis of total knee replacement (HCC) 12/08/2018  . Asthma, exercise induced 09/20/2014  . Overweight 09/20/2014   Staci Acosta PT, DPT Staci Acosta 11/05/2019, 11:03 AM  [facility] [address], [place], [address] Phone: [phone]   Fax:  [phone]  Name: SALLE BRANDLE MRN: [id number] Date of Birth: January 12, 1956

## 2019-11-05 ENCOUNTER — Ambulatory Visit: Admitting: Physical Therapy

## 2019-11-09 ENCOUNTER — Encounter: Payer: Self-pay | Admitting: Physical Therapy

## 2019-11-09 ENCOUNTER — Ambulatory Visit: Admitting: Physical Therapy

## 2019-11-09 ENCOUNTER — Other Ambulatory Visit: Payer: Self-pay

## 2019-11-09 DIAGNOSIS — G8929 Other chronic pain: Secondary | ICD-10-CM

## 2019-11-09 DIAGNOSIS — M25662 Stiffness of left knee, not elsewhere classified: Secondary | ICD-10-CM | POA: Diagnosis not present

## 2019-11-09 DIAGNOSIS — M6281 Muscle weakness (generalized): Secondary | ICD-10-CM

## 2019-11-09 NOTE — Therapy (Signed)
[facility] [address] [place], [address] Phone: [phone]   Fax:  [phone]  Physical Therapy Treatment  Patient Details  Name: Traci Drake MRN: [id number] Date of Birth: 05/08/1956 No data recorded  Encounter Date: 11/09/2019  PT End of Session - 11/09/19 0847    Visit Number  2    Number of Visits  17    Date for PT Re-Evaluation  01/01/20    PT Start Time  0817    PT Stop Time  0858    PT Time Calculation (min)  41 min    Activity Tolerance  Patient tolerated treatment well;No increased pain    Behavior During Therapy  WFL for tasks assessed/performed       Past Medical History:  Diagnosis Date  . Asthma    seasonal  . Complication of anesthesia    needed albumin in PACU. and had vertigo  . Decreased libido without sexual dysfunction   . Menopause     Past Surgical History:  Procedure Laterality Date  . BREAST BIOPSY  2003  . KNEE ARTHROPLASTY Right 11/2016   Partial   . KNEE ARTHROSCOPY Left    6-8 years ago  . KNEE CLOSED REDUCTION Left 11/02/2019   Procedure: CLOSED MANIPULATION KNEE;  Surgeon: Ollen Gross, MD;  Location: [facility];  Service: Orthopedics;  Laterality: Left;   . TOTAL KNEE ARTHROPLASTY Left 12/08/2018   Procedure: TOTAL KNEE ARTHROPLASTY;  Surgeon: Ollen Gross, MD;  Location: [facility];  Service: Orthopedics;  Laterality: Left;    There were no vitals filed for this visit.  Subjective Assessment - 11/09/19 0819    Subjective  Patient went to the beach over the weeked, had a good time, without knee pain. Reports she is completing HEP and thinks the flexion stretch is helpful.    Pertinent History  Patient s/p L TKA 12/08/18 with successful rehab here, discharge with full ROM and strength 03/10/19. Reports in Jan 2021 she felt like her hamstring started getting very tight and she lost motion of the knee. She returned to Dr. Despina Hick, reporting she only had  about 95d of flex and he completed manipluation under anesthesia 11/02/19, with 135d flexion gained. She reports that she felt like she had a lot of motion following procedure, but that it has stiffened up since then. 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 Reports difficulty with step negotation over things in the yard (as she likes to garden), squatting and bending. Reports minimal pain, other than 2/10 pain today following procedure Monday. Pt denies N/V, B&B changes, unexplained weight fluctuation, saddle paresthesia, fever, night sweats, or unrelenting night pain at this time.    Limitations  House hold activities;Standing;Lifting    How long can you sit comfortably?  unlmited    How long can you stand comfortably?  unlimited    How long can you walk comfortably?  unlimited    Diagnostic tests  none since TKA    Patient Stated Goals  Increased motion and strength    Pain Onset  More than a month ago         Ther-Ex Recumbant bike seat 8 L4 for low grade strengthening; knee flex @ 110d Donkey kick bent over mat table 3x 10 with heavy cuing for glute activaiton with decent carry over 5xSTS 10sec SLS R 30sec L 27sec heavy ankle  strategy needed Prone hip ext x10 2# AW 2x 10 with heavy cuing for glute set prior, with patient reporting she is able to "feel exercise in her glute if she "really focuses" with good success SL bridge 3x 5 with better carry over of glute activation  Lunge 2x 10 with cuing to "push through heel" for glute activation with good carry over OMEGA hamstring curl 25# x5 20# x10  OMEGA leg press 25# x10; 35# x5                 PT Education - 11/09/19 0844    Education provided  Yes    Education Details  therex form    Person(s) Educated  Patient    Methods  Explanation;Demonstration;Verbal cues    Comprehension  Verbalized understanding;Returned demonstration;Verbal cues  required       PT Short Term Goals - 11/05/19 1048      PT SHORT TERM GOAL #1   Title  Pt will be independent with HEP in order to decrease ankle pain and increase strength in order to improve pain-free function at home and work.    Baseline  11/05/19 HEP given    Time  4    Period  Weeks    Status  New      PT SHORT TERM GOAL #2   Title  Patient will demonstrate PROM WNL in order to demonstrate adequate joint motion for ADLs    Baseline  11/05/19 0d - 119d    Time  4    Period  Weeks    Status  New        PT Long Term Goals - 11/05/19 1048      PT LONG TERM GOAL #1   Title  Patient will increase FOTO score to 67 to demonstrate predicted increase in functional mobility to complete ADLs    Baseline  11/05/19 60    Time  8    Period  Weeks      PT LONG TERM GOAL #2   Title  Patient will demonstrate 1RM of L hamstring curl of 35# to demonstrate symmetry of RLE and increased knee flex strength    Baseline  11/05/19 25#    Time  8    Period  Weeks    Status  New      PT LONG TERM GOAL #3   Title  Patient will demonstrate knee AROM WNL in order to complete yard work and stair negotiation    Baseline  11/05/19 -4d - 105d    Time  8    Period  Weeks    Status  New            Plan - 11/09/19 0849    Clinical Impression Statement  PT initiated therex progression for for posterior LLE musculature with good success. Following demo and heavy cuing for glute activaiton, patient is able to acheive. Patient responds well to edcuation on motor control and "brain mapping". Patient able to complete progression with some increased lateral knee pain that feels "like a bruise". PT will continue progression as able.    Personal Factors and Comorbidities  Age;Comorbidity 1;Past/Current Experience;Sex;Time since onset of injury/illness/exacerbation    Comorbidities  OA, previous sx on L knee    Examination-Activity Limitations  Bathing;Squat;Lift;Stairs;Bed Mobility;Bend;Locomotion  Level;Stand;Carry;Transfers;Sit;Sleep    Examination-Participation Restrictions  Cleaning;Laundry;Community Activity;Driving    Stability/Clinical Decision Making  Evolving/Moderate complexity    Clinical Decision Making  Moderate    Rehab Potential  Good  Clinical Impairments Affecting Rehab Potential  Positive: motivation, age, partial replacement, active; Negative: history of L knee problems    PT Frequency  2x / week    PT Duration  8 weeks    PT Treatment/Interventions  Aquatic Therapy;Electrical Stimulation;Cryotherapy;Iontophoresis 4mg /ml Dexamethasone;Moist Heat;DME Instruction;Functional mobility training;Therapeutic activities;Neuromuscular re-education;Dry needling;Passive range of motion;Joint Manipulations;Taping;Manual techniques;Therapeutic exercise;Gait training;Stair training;Patient/family education    PT Next Visit Plan  glute activation, hamstring strengthening    PT Home Exercise Plan  SL bridge, lunge, knee flex stretch, hip flexor stretch    Consulted and Agree with Plan of Care  Patient       Patient will benefit from skilled therapeutic intervention in order to improve the following deficits and impairments:  Abnormal gait, Decreased activity tolerance, Decreased coordination, Decreased strength, Increased fascial restricitons, Impaired flexibility, Pain, Postural dysfunction, Improper body mechanics, Decreased range of motion, Difficulty walking, Decreased endurance, Decreased mobility, Increased muscle spasms  Visit Diagnosis: Muscle weakness (generalized)  Chronic pain of left knee  Stiffness of left knee, not elsewhere classified     Problem List Patient Active Problem List   Diagnosis Date Noted  . Arthrofibrosis of total knee replacement (HCC) 12/08/2018  . Asthma, exercise induced 09/20/2014  . Overweight 09/20/2014   11/20/2014 PT, DPT Staci Acosta 11/09/2019, 9:23 AM  [facility] [address], [place] Phone: [phone]   Fax:  [phone]  Name: Traci Drake MRN: [id number] Date of Birth: May 27, 1956

## 2019-11-11 ENCOUNTER — Encounter: Admitting: Physical Therapy

## 2019-11-12 ENCOUNTER — Ambulatory Visit: Admitting: Physical Therapy

## 2019-11-18 ENCOUNTER — Encounter: Admitting: Physical Therapy

## 2019-11-24 ENCOUNTER — Other Ambulatory Visit: Payer: Self-pay

## 2019-11-24 ENCOUNTER — Ambulatory Visit: Attending: Orthopedic Surgery | Admitting: Physical Therapy

## 2019-11-24 ENCOUNTER — Encounter: Payer: Self-pay | Admitting: Physical Therapy

## 2019-11-24 DIAGNOSIS — M25662 Stiffness of left knee, not elsewhere classified: Secondary | ICD-10-CM | POA: Diagnosis present

## 2019-11-24 DIAGNOSIS — M6281 Muscle weakness (generalized): Secondary | ICD-10-CM | POA: Diagnosis not present

## 2019-11-24 DIAGNOSIS — M25562 Pain in left knee: Secondary | ICD-10-CM | POA: Diagnosis present

## 2019-11-24 DIAGNOSIS — G8929 Other chronic pain: Secondary | ICD-10-CM | POA: Diagnosis present

## 2019-11-24 NOTE — Therapy (Signed)
[place] [facility] [address], [place], [address] Phone: [phone]   Fax:  [phone]  Physical Therapy Treatment  Patient Details  Name: Traci Drake MRN: [id number] Date of Birth: [birth year] No data recorded  Encounter Date: 11/24/2019  PT End of Session - 11/24/19 1705    Visit Number  3    Number of Visits  17    Date for PT Re-Evaluation  01/01/20    PT Start Time  0500    PT Stop Time  0540    PT Time Calculation (min)  40 min    Equipment Utilized During Treatment  Gait belt    Activity Tolerance  Patient tolerated treatment well;No increased pain    Behavior During Therapy  WFL for tasks assessed/performed       Past Medical History:  Diagnosis Date  . Asthma    seasonal  . Complication of anesthesia    needed albumin in PACU. and had vertigo  . Decreased libido without sexual dysfunction   . Menopause     Past Surgical History:  Procedure Laterality Date  . BREAST BIOPSY  2003  . KNEE ARTHROPLASTY Right 11/2016   Partial   . KNEE ARTHROSCOPY Left    6-8 years ago  . KNEE CLOSED REDUCTION Left 11/02/2019   Procedure: CLOSED MANIPULATION KNEE;  Surgeon: Ollen Gross, MD;  Location: [facility];  Service: Orthopedics;  Laterality: Left;   . TOTAL KNEE ARTHROPLASTY Left 12/08/2018   Procedure: TOTAL KNEE ARTHROPLASTY;  Surgeon: Ollen Gross, MD;  Location: [facility];  Service: Orthopedics;  Laterality: Left;    There were no vitals filed for this visit.  Subjective Assessment - 11/24/19 1703    Subjective  Patient reports she got a good report from PA last week, measured flexion at 112d. Reports no pain, compliance with HEP, has been able to fire glute better with HEP.    Pertinent History  Patient s/p L TKA 12/08/18 with successful rehab here, discharge with full ROM and strength 03/10/19. Reports in Jan 2021 she felt like her hamstring started getting very tight and she lost motion of  the knee. She returned to Dr. Despina Hick, reporting she only had about 95d of flex and he completed manipluation under anesthesia 11/02/19, with 135d flexion gained. She reports that she felt like she had a lot of motion following procedure, but that it has stiffened up since then. 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 Reports difficulty with step negotation over things in the yard (as she likes to garden), squatting and bending. Reports minimal pain, other than 2/10 pain today following procedure Monday. Pt denies N/V, B&B changes, unexplained weight fluctuation, saddle paresthesia, fever, night sweats, or unrelenting night pain at this time.    Limitations  House hold activities;Standing;Lifting    How long can you sit comfortably?  unlmited    How long can you stand comfortably?  unlimited    How long can you walk comfortably?  unlimited    Diagnostic tests  none since TKA    Patient Stated Goals  Increased motion and strength    Pain Onset  More than a month ago       Ther-Ex Recumbant bike seat 8 L4 for low grade strengthening; knee flex @ 110d SL bridge 3x 6/7/8 with better carry over of glute activation  Donkey kick bent over  mat table x10; with 2# 2x 10 with pt having to complete with knee ext d/t  [place] split squat 3x 5 with demo and heavy cuing for  OMEGA hamstring curl 15# 3x 10 (unable to tolerate 20 or 25# this session) OMEGA leg press 35# 3 x6/5/5                          PT Education - 11/24/19 1704    Education provided  Yes    Education Details  therex form, glute activation    Person(s) Educated  Patient    Methods  Explanation;Demonstration;Verbal cues    Comprehension  Verbalized understanding;Returned demonstration;Verbal cues required       PT Short Term Goals - 11/05/19 1048      PT SHORT TERM GOAL #1   Title  Pt will be independent with HEP in order to decrease  ankle pain and increase strength in order to improve pain-free function at home and work.    Baseline  11/05/19 HEP given    Time  4    Period  Weeks    Status  New      PT SHORT TERM GOAL #2   Title  Patient will demonstrate PROM WNL in order to demonstrate adequate joint motion for ADLs    Baseline  11/05/19 0d - 119d    Time  4    Period  Weeks    Status  New        PT Long Term Goals - 11/05/19 1048      PT LONG TERM GOAL #1   Title  Patient will increase FOTO score to 67 to demonstrate predicted increase in functional mobility to complete ADLs    Baseline  11/05/19 60    Time  8    Period  Weeks      PT LONG TERM GOAL #2   Title  Patient will demonstrate 1RM of L hamstring curl of 35# to demonstrate symmetry of RLE and increased knee flex strength    Baseline  11/05/19 25#    Time  8    Period  Weeks    Status  New      PT LONG TERM GOAL #3   Title  Patient will demonstrate knee AROM WNL in order to complete yard work and stair negotiation    Baseline  11/05/19 -4d - 105d    Time  8    Period  Weeks    Status  New            Plan - 11/24/19 1739    Clinical Impression Statement  PT continued therex progression for increased posterior LLE strength with good success. Patient is able to tolerate progression with cuing for technique with good carry over and good motivation throughout session. Patient reports no increased pain throughout session. PT will continue progression as able.    Personal Factors and Comorbidities  Age;Comorbidity 1;Past/Current Experience;Sex;Time since onset of injury/illness/exacerbation    Comorbidities  OA, previous sx on L knee    Examination-Activity Limitations  Bathing;Squat;Lift;Stairs;Bed Mobility;Bend;Locomotion Level;Stand;Carry;Transfers;Sit;Sleep    Examination-Participation Restrictions  Cleaning;Laundry;Community Activity;Driving    Stability/Clinical Decision Making  Evolving/Moderate complexity    Clinical Decision Making   Moderate    Rehab Potential  Good    Clinical Impairments Affecting Rehab Potential  Positive: motivation, age, partial replacement, active; Negative: history of L knee problems    PT Frequency  2x / week  PT Duration  8 weeks    PT Treatment/Interventions  Aquatic Therapy;Electrical Stimulation;Cryotherapy;Iontophoresis 4mg /ml Dexamethasone;Moist Heat;DME Instruction;Functional mobility training;Therapeutic activities;Neuromuscular re-education;Dry needling;Passive range of motion;Joint Manipulations;Taping;Manual techniques;Therapeutic exercise;Gait training;Stair training;Patient/family education    PT Next Visit Plan  glute activation, hamstring strengthening    PT Home Exercise Plan  SL bridge, lunge, knee flex stretch, hip flexor stretch    Consulted and Agree with Plan of Care  Patient       Patient will benefit from skilled therapeutic intervention in order to improve the following deficits and impairments:  Abnormal gait, Decreased activity tolerance, Decreased coordination, Decreased strength, Increased fascial restricitons, Impaired flexibility, Pain, Postural dysfunction, Improper body mechanics, Decreased range of motion, Difficulty walking, Decreased endurance, Decreased mobility, Increased muscle spasms  Visit Diagnosis: Muscle weakness (generalized)  Chronic pain of left knee  Stiffness of left knee, not elsewhere classified     Problem List Patient Active Problem List   Diagnosis Date Noted  . Arthrofibrosis of total knee replacement (HCC) 12/08/2018  . Asthma, exercise induced 09/20/2014  . Overweight 09/20/2014   11/20/2014 DPT  Hilda Lias 11/24/2019, 5:42 PM  [place] [facility] PHYSICAL AND SPORTS MEDICINE [address], [place] Phone: [phone]   Fax:  [phone]  Name: YUKARI FLAX MRN: Stevie Kern Date of Birth: [birth year]

## 2019-12-01 ENCOUNTER — Other Ambulatory Visit: Payer: Self-pay

## 2019-12-01 ENCOUNTER — Encounter: Payer: Self-pay | Admitting: Physical Therapy

## 2019-12-01 ENCOUNTER — Ambulatory Visit: Admitting: Physical Therapy

## 2019-12-01 DIAGNOSIS — M6281 Muscle weakness (generalized): Secondary | ICD-10-CM | POA: Diagnosis not present

## 2019-12-01 DIAGNOSIS — M25662 Stiffness of left knee, not elsewhere classified: Secondary | ICD-10-CM

## 2019-12-01 DIAGNOSIS — G8929 Other chronic pain: Secondary | ICD-10-CM

## 2019-12-01 NOTE — Therapy (Signed)
[place] [facility] AND SPORTS MEDICINE [address], [place], [address] Phone: [phone]   Fax:  [phone]  Physical Therapy Treatment  Patient Details  Name: Traci Drake MRN: [id number] Date of Birth: March 02, 1956 No data recorded  Encounter Date: 12/01/2019   PT End of Session - 12/01/19 1723    Visit Number 4    Number of Visits 17    Date for PT Re-Evaluation 01/01/20    PT Start Time 0350    PT Stop Time 1810    PT Time Calculation (min) 40 min    Equipment Utilized During Treatment Gait belt    Activity Tolerance Patient tolerated treatment well;No increased pain    Behavior During Therapy WFL for tasks assessed/performed           Past Medical History:  Diagnosis Date  . Asthma    seasonal  . Complication of anesthesia    needed albumin in PACU. and had vertigo  . Decreased libido without sexual dysfunction   . Menopause     Past Surgical History:  Procedure Laterality Date  . BREAST BIOPSY  2003  . KNEE ARTHROPLASTY Right 11/2016   Partial   . KNEE ARTHROSCOPY Left    6-8 years ago  . KNEE CLOSED REDUCTION Left 11/02/2019   Procedure: CLOSED MANIPULATION KNEE;  Surgeon: Gaynelle Arabian, MD;  Location: [facility];  Service: Orthopedics;  Laterality: Left;  91min  . 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   Subjective Assessment - 12/01/19 1725    Subjective Pt reports being on her feet all day at work; and that she did a lot of walking this weekend and her LEs are a little tired.  Pt reports 2-3/10 pain and stiffness currently from the day's activities.  Pt took the stairs at work today instead of the Media planner and reports decent tolerance. Compliance with HEP.    Pertinent History Patient s/p L TKA 12/08/18 with successful rehab here,  discharge with full ROM and strength 03/10/19. Reports in Jan 2021 she felt like her hamstring started getting very tight and she lost motion of the knee. She returned to Dr. Maureen Ralphs, reporting she only had about 95d of flex and he completed manipluation under anesthesia 11/02/19, with 135d flexion gained. She reports that she felt like she had a lot of motion following procedure, but that it has stiffened up since then. She is still working as a Programmer, systems full time, and reports she has not had a consistent exercise regimen. Reports difficulty with step negotation over things in the yard (as she likes to garden), squatting and bending. Reports minimal pain, other than 2/10 pain today following procedure Monday. Pt denies N/V, B&B changes, unexplained weight fluctuation, saddle paresthesia, fever, night sweats, or unrelenting night pain at this time.    Limitations House hold activities;Standing;Lifting    How long can you sit comfortably? unlmited    How long can you stand comfortably? unlimited    How long can you walk comfortably? unlimited    Diagnostic tests none since TKA    Patient Stated Goals Increased motion and strength    Currently in Pain? Yes    Pain Score 2     Pain Location Knee    Pain Orientation Left    Pain Onset  More than a month ago           ThereEx Recumbent bike seat 8 L4 for low grade strengthening SL glute bridge 3x8 with cueing for glute activation Sidelying abduction with 3# ankle weight 3x8 with cueing to reduce hip flexion and isolate hip abd Standing R hip hike x10 Standing R hip hike with tap on 3" step 3x10 Split squat with R foot on 3" step 3x8  OMEGA hamstring curl #25 3x10 Seated knee flexion stretch  Manual Therapy Supine and seated combined traction and posterior tib mobilization grade 3-4 30sec bouts x6 in each position with 124d flexion obtained following    PT Education - 12/01/19 1731    Education provided Yes    Education Details  Therex form, glute activation    Person(s) Educated Patient    Methods Explanation;Demonstration;Verbal cues    Comprehension Verbalized understanding;Returned demonstration;Verbal cues required            PT Short Term Goals - 11/05/19 1048      PT SHORT TERM GOAL #1   Title Pt will be independent with HEP in order to decrease ankle pain and increase strength in order to improve pain-free function at home and work.    Baseline 11/05/19 HEP given    Time 4    Period Weeks    Status New      PT SHORT TERM GOAL #2   Title Patient will demonstrate PROM WNL in order to demonstrate adequate joint motion for ADLs    Baseline 11/05/19 0d - 119d    Time 4    Period Weeks    Status New             PT Long Term Goals - 11/05/19 1048      PT LONG TERM GOAL #1   Title Patient will increase FOTO score to 67 to demonstrate predicted increase in functional mobility to complete ADLs    Baseline 11/05/19 60    Time 8    Period Weeks      PT LONG TERM GOAL #2   Title Patient will demonstrate 1RM of L hamstring curl of 35# to demonstrate symmetry of RLE and increased knee flex strength    Baseline 11/05/19 25#    Time 8    Period Weeks    Status New      PT LONG TERM GOAL #3   Title Patient will demonstrate knee AROM WNL in order to complete yard work and stair negotiation    Baseline 11/05/19 -4d - 105d    Time 8    Period Weeks    Status New                 Plan - 12/01/19 1732    Clinical Impression Statement PT continued therex progression for LE and hip strengthening with good success and no increase in pain.  Pt able to tolerate progressing with min verbal cueing for technique with good carry over and good motivation.  Pt L knee PROM increased to 124d following therex and MT.  PT will continue progression as able.    Personal Factors and Comorbidities Age;Comorbidity 1;Past/Current Experience;Sex;Time since onset of injury/illness/exacerbation    Comorbidities OA,  previous sx on L knee    Examination-Activity Limitations Bathing;Squat;Lift;Stairs;Bed Mobility;Bend;Locomotion Level;Stand;Carry;Transfers;Sit;Sleep    Examination-Participation Restrictions Cleaning;Laundry;Community Activity;Driving    Stability/Clinical Decision Making Evolving/Moderate complexity    Clinical Decision Making Moderate    Rehab Potential Good    Clinical  Impairments Affecting Rehab Potential Positive: motivation, age, partial replacement, active; Negative: history of L knee problems    PT Frequency 2x / week    PT Duration 8 weeks    PT Treatment/Interventions Aquatic Therapy;Electrical Stimulation;Cryotherapy;Iontophoresis 4mg /ml Dexamethasone;Moist Heat;DME Instruction;Functional mobility training;Therapeutic activities;Neuromuscular re-education;Dry needling;Passive range of motion;Joint Manipulations;Taping;Manual techniques;Therapeutic exercise;Gait training;Stair training;Patient/family education    PT Next Visit Plan glute activation, hamstring strengthening    PT Home Exercise Plan SL bridge, lunge, knee flex stretch, hip flexor stretch    Consulted and Agree with Plan of Care Patient           Patient will benefit from skilled therapeutic intervention in order to improve the following deficits and impairments:  Abnormal gait, Decreased activity tolerance, Decreased coordination, Decreased strength, Increased fascial restricitons, Impaired flexibility, Pain, Postural dysfunction, Improper body mechanics, Decreased range of motion, Difficulty walking, Decreased endurance, Decreased mobility, Increased muscle spasms  Visit Diagnosis: Muscle weakness (generalized)  Chronic pain of left knee  Stiffness of left knee, not elsewhere classified     Problem List Patient Active Problem List   Diagnosis Date Noted  . Arthrofibrosis of total knee replacement (HCC) 12/08/2018  . Asthma, exercise induced 09/20/2014  . Overweight 09/20/2014    11/20/2014  DPT Hilda Lias, SPT Ricarda Frame 12/02/2019, 8:56 AM  [place] [facility] PHYSICAL AND SPORTS MEDICINE [address], [place] Phone: [phone]   Fax:  [phone]  Name: Traci Drake MRN: Stevie Kern Date of Birth: [birth year]

## 2019-12-08 ENCOUNTER — Ambulatory Visit: Admitting: Physical Therapy

## 2019-12-08 ENCOUNTER — Other Ambulatory Visit: Payer: Self-pay

## 2019-12-08 DIAGNOSIS — M25562 Pain in left knee: Secondary | ICD-10-CM

## 2019-12-08 DIAGNOSIS — M6281 Muscle weakness (generalized): Secondary | ICD-10-CM

## 2019-12-08 DIAGNOSIS — M25662 Stiffness of left knee, not elsewhere classified: Secondary | ICD-10-CM

## 2019-12-08 NOTE — Therapy (Signed)
[place] [facility] PHYS[facility] MEDICINE [address] [place], [address] Phone: [phone]   Fax:  [phone]  Physical Therapy Treatment  Patient Details  Name: Traci Drake MRN: [id number] Date of Birth: Mar 20, 1956 No data recorded  Encounter Date: 12/08/2019   PT End of Session - 12/08/19 1737    Visit Number 5    Number of Visits 17    Date for PT Re-Evaluation 01/01/20    PT Start Time 1730    PT Stop Time 1815    PT Time Calculation (min) 45 min    Equipment Utilized During Treatment Gait belt    Activity Tolerance Patient tolerated treatment well;No increased pain    Behavior During Therapy WFL for tasks assessed/performed           Past Medical History:  Diagnosis Date  . Asthma    seasonal  . Complication of anesthesia    needed albumin in PACU. and had vertigo  . Decreased libido without sexual dysfunction   . Menopause     Past Surgical History:  Procedure Laterality Date  . BREAST BIOPSY  2003  . KNEE ARTHROPLASTY Right 11/2016   Partial   . KNEE ARTHROSCOPY Left    6-8 years ago  . KNEE CLOSED REDUCTION Left 11/02/2019   Procedure: CLOSED MANIPULATION KNEE;  Surgeon: Ollen Gross, MD;  Location: [facility];  Service: Orthopedics;  Laterality: Left;   . TOTAL KNEE ARTHROPLASTY Left 12/08/2018   Procedure: TOTAL KNEE ARTHROPLASTY;  Surgeon: Ollen Gross, MD;  Location: [facility];  Service: Orthopedics;  Laterality: Left;    There were no vitals filed for this visit.   Subjective Assessment - 12/08/19 1730    Subjective Pt reports 5/10 pain in L knee over the past 3 days, currently 3/10 after taking Motrin; increased pain noted with self-palpation superior to patella.  Pt reports working in the yard for several hours Saturday morning, more than usual with lots of bending and squatting. Pt reports taking the stairs twice today at work trying to work through the pain; increased pain with descending  stairs.  Compliance with HEP.    Pertinent History Patient s/p L TKA 12/08/18 with successful rehab here, discharge with full ROM and strength 03/10/19. Reports in Jan 2021 she felt like her hamstring started getting very tight and she lost motion of the knee. She returned to Dr. Despina Hick, reporting she only had about 95d of flex and he completed manipluation under anesthesia 11/02/19, with 135d flexion gained. She reports that she felt like she had a lot of motion following procedure, but that it has stiffened up since then. 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 Reports difficulty with step negotation over things in the yard (as she likes to garden), squatting and bending. Reports minimal pain, other than 2/10 pain today following procedure Monday. Pt denies N/V, B&B changes, unexplained weight fluctuation, saddle paresthesia, fever, night sweats, or unrelenting night pain at this time.    Limitations House hold activities;Standing;Lifting    How long can you sit comfortably? unlmited    How long can you stand comfortably? unlimited    How long can you walk comfortably? unlimited    Diagnostic tests none since TKA    Patient Stated Goals Increased motion and strength    Currently in Pain? Yes    Pain Score 3     Pain Onset  More than a month ago           ThereEx Recumbent bike seat 8 L4 for low grade strengthening Standing R hip hike on 3" step 3x10 Split squat with R foot on 3" step 3x8 Step up onto 12" stair 2x6 to facilitate strengthening from a increased knee flexion position Omega hamstring curl #25 1x5, #20 2x5 to 90d of flexion Step over 12" stool with CGA and UE support to maintain balance, with cueing to increase hip flexion to compensate for lack of R knee flexion (pt with ~90d of active R knee flexion)  Manual Therapy Seated combined traction and posterior tib mobilization grade 3-4 30sec bouts x6 with  115d flexion obtained following     PT Education - 12/08/19 1736    Education Details Therex form/mechanics    Person(s) Educated Patient    Methods Explanation;Demonstration;Verbal cues    Comprehension Verbalized understanding;Returned demonstration;Verbal cues required            PT Short Term Goals - 11/05/19 1048      PT SHORT TERM GOAL #1   Title Pt will be independent with HEP in order to decrease ankle pain and increase strength in order to improve pain-free function at home and work.    Baseline 11/05/19 HEP given    Time 4    Period Weeks    Status New      PT SHORT TERM GOAL #2   Title Patient will demonstrate PROM WNL in order to demonstrate adequate joint motion for ADLs    Baseline 11/05/19 0d - 119d    Time 4    Period Weeks    Status New             PT Long Term Goals - 11/05/19 1048      PT LONG TERM GOAL #1   Title Patient will increase FOTO score to 67 to demonstrate predicted increase in functional mobility to complete ADLs    Baseline 11/05/19 60    Time 8    Period Weeks      PT LONG TERM GOAL #2   Title Patient will demonstrate 1RM of L hamstring curl of 35# to demonstrate symmetry of RLE and increased knee flex strength    Baseline 11/05/19 25#    Time 8    Period Weeks    Status New      PT LONG TERM GOAL #3   Title Patient will demonstrate knee AROM WNL in order to complete yard work and stair negotiation    Baseline 11/05/19 -4d - 105d    Time 8    Period Weeks    Status New                 Plan - 12/08/19 1812    Clinical Impression Statement PT continued therex progression for LE and hip strengthening with good success and no increase in pain.  Pt has difficulty activating hip extensors with R knee at >80d of flexion; demonstrated improved motor control of task with progression of reps, but needs further instruction for proper motor control while knee is flexed.  Pt unable to efficiently clear 12" stool d/t lack of active knee  extension with some improvement with cueing for increase hip flexion.  PT will continue progression as able.    Personal Factors and Comorbidities Age;Comorbidity 1;Past/Current Experience;Sex;Time since onset of injury/illness/exacerbation    Comorbidities OA, previous sx on L knee    Examination-Activity Limitations Bathing;Squat;Lift;Stairs;Bed Mobility;Bend;Locomotion  Level;Stand;Carry;Transfers;Sit;Sleep    Examination-Participation Restrictions Cleaning;Laundry;Community Activity;Driving    Stability/Clinical Decision Making Evolving/Moderate complexity    Clinical Decision Making Moderate    Rehab Potential Good    Clinical Impairments Affecting Rehab Potential Positive: motivation, age, partial replacement, active; Negative: history of L knee problems    PT Frequency 2x / week    PT Duration 8 weeks    PT Treatment/Interventions Aquatic Therapy;Electrical Stimulation;Cryotherapy;Iontophoresis 4mg /ml Dexamethasone;Moist Heat;DME Instruction;Functional mobility training;Therapeutic activities;Neuromuscular re-education;Dry needling;Passive range of motion;Joint Manipulations;Taping;Manual techniques;Therapeutic exercise;Gait training;Stair training;Patient/family education    PT Next Visit Plan Motor control of stepping up and over objects of 12" or greater    PT Home Exercise Plan SL bridge, lunge, knee flex stretch, hip flexor stretch    Consulted and Agree with Plan of Care Patient           Patient will benefit from skilled therapeutic intervention in order to improve the following deficits and impairments:  Abnormal gait, Decreased activity tolerance, Decreased coordination, Decreased strength, Increased fascial restricitons, Impaired flexibility, Pain, Postural dysfunction, Improper body mechanics, Decreased range of motion, Difficulty walking, Decreased endurance, Decreased mobility, Increased muscle spasms  Visit Diagnosis: Muscle weakness (generalized)  Chronic pain of left  knee  Stiffness of left knee, not elsewhere classified     Problem List Patient Active Problem List   Diagnosis Date Noted  . Arthrofibrosis of total knee replacement ([place]) 12/08/2018  . Asthma, exercise induced 09/20/2014  . Overweight 09/20/2014   Durwin Reges DPT  Chinita Greenland, SPT Durwin Reges 12/09/2019, 4:23 PM  [place] PH[facility] AND [facility]ORTS MED[facility] [address] [place], [address] Phone: [phone]   Fax:  [phone]  Name: Traci Drake MRN: [id number] Date of Birth: Oct 24, 1955

## 2019-12-09 ENCOUNTER — Ambulatory Visit: Admitting: Physical Therapy

## 2019-12-09 DIAGNOSIS — G8929 Other chronic pain: Secondary | ICD-10-CM

## 2019-12-09 DIAGNOSIS — M6281 Muscle weakness (generalized): Secondary | ICD-10-CM

## 2019-12-09 DIAGNOSIS — M25662 Stiffness of left knee, not elsewhere classified: Secondary | ICD-10-CM

## 2019-12-09 NOTE — Therapy (Signed)
[facility] [address], [place], [address] Phone: [phone]   Fax:  [phone]  Physical Therapy Treatment  Patient Details  Name: Traci Drake MRN: [id number] Date of Birth: March 20, 1956 No data recorded  Encounter Date: 12/09/2019   PT End of Session - 12/09/19 1620    Visit Number 6    Number of Visits 17    Date for PT Re-Evaluation 01/01/20    PT Start Time 1615    PT Stop Time 1655    PT Time Calculation (min) 40 min    Activity Tolerance Patient tolerated treatment well;No increased pain    Behavior During Therapy WFL for tasks assessed/performed           Past Medical History:  Diagnosis Date  . Asthma    seasonal  . Complication of anesthesia    needed albumin in PACU. and had vertigo  . Decreased libido without sexual dysfunction   . Menopause     Past Surgical History:  Procedure Laterality Date  . BREAST BIOPSY  2003  . KNEE ARTHROPLASTY Right 11/2016   Partial   . KNEE ARTHROSCOPY Left    6-8 years ago  . KNEE CLOSED REDUCTION Left 11/02/2019   Procedure: CLOSED MANIPULATION KNEE;  Surgeon: Ollen Gross, MD;  Location: [facility];  Service: Orthopedics;  Laterality: Left;   . TOTAL KNEE ARTHROPLASTY Left 12/08/2018   Procedure: TOTAL KNEE ARTHROPLASTY;  Surgeon: Ollen Gross, MD;  Location: [facility];  Service: Orthopedics;  Laterality: Left;    There were no vitals filed for this visit.   Subjective Assessment - 12/09/19 1618    Subjective Pt reports less pain today 2/10 with some soreness after yesterday's session.  Pt reports taking the stairs and walking a lot of walking at work this morning.    Pertinent History Patient s/p L TKA 12/08/18 with successful rehab here, discharge with full ROM and strength 03/10/19. Reports in Jan 2021 she felt like her hamstring started getting very tight and she lost motion of the knee. She returned to Dr. Despina Hick, reporting she only  had about 95d of flex and he completed manipluation under anesthesia 11/02/19, with 135d flexion gained. She reports that she felt like she had a lot of motion following procedure, but that it has stiffened up since then. 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 Reports difficulty with step negotation over things in the yard (as she likes to garden), squatting and bending. Reports minimal pain, other than 2/10 pain today following procedure Monday. Pt denies N/V, B&B changes, unexplained weight fluctuation, saddle paresthesia, fever, night sweats, or unrelenting night pain at this time.    Limitations House hold activities;Standing;Lifting    How long can you sit comfortably? unlmited    How long can you stand comfortably? unlimited    How long can you walk comfortably? unlimited    Diagnostic tests none since TKA    Patient Stated Goals Increased motion and strength    Currently in Pain? Yes    Pain Score 2     Pain Onset More than a month ago           ThereEx Recumbent bike seat 8 L3 for low grade strengthening Standing R hip hike with flexion/extension leg swings 3x10 Lateral stepping with GTB 2x8 B LLE stair tapping on 12" step 3x15 for motor control  with combined hip and knee flexion for foot clearance over objects Deep squat with TRX 2x8 (105d knee flexion on L, 110d on R) Step over 8" step x15 for motor control with stepping over objects  Manual Therapy Seated combined traction and posterior tib mobilization grade 3-4 30sec bouts x6 with 120d flexion obtained following     PT Education - 12/09/19 1619    Education provided Yes    Education Details therex form/mechanics    Person(s) Educated Patient    Methods Explanation;Demonstration;Verbal cues    Comprehension Verbalized understanding;Returned demonstration            PT Short Term Goals - 11/05/19 1048      PT SHORT TERM GOAL #1   Title  Pt will be independent with HEP in order to decrease ankle pain and increase strength in order to improve pain-free function at home and work.    Baseline 11/05/19 HEP given    Time 4    Period Weeks    Status New      PT SHORT TERM GOAL #2   Title Patient will demonstrate PROM WNL in order to demonstrate adequate joint motion for ADLs    Baseline 11/05/19 0d - 119d    Time 4    Period Weeks    Status New             PT Long Term Goals - 11/05/19 1048      PT LONG TERM GOAL #1   Title Patient will increase FOTO score to 67 to demonstrate predicted increase in functional mobility to complete ADLs    Baseline 11/05/19 60    Time 8    Period Weeks      PT LONG TERM GOAL #2   Title Patient will demonstrate 1RM of L hamstring curl of 35# to demonstrate symmetry of RLE and increased knee flex strength    Baseline 11/05/19 25#    Time 8    Period Weeks    Status New      PT LONG TERM GOAL #3   Title Patient will demonstrate knee AROM WNL in order to complete yard work and stair negotiation    Baseline 11/05/19 -4d - 105d    Time 8    Period Weeks    Status New                 Plan - 12/09/19 1656    Clinical Impression Statement PT continued therex progression with a focus on motor control of hip and knee, primarily combined hip and knee flexion.  Pt has difficulty but is able to clear a 12" step up with moderate cueing to increase knee flexion and reduce circumduction compensation; able to step up and over 8" step with improved motor control.  Pt able to reach 120d of flexion with PT assisted PROM.  PT will continue progression as able.    Personal Factors and Comorbidities Age;Comorbidity 1;Past/Current Experience;Sex;Time since onset of injury/illness/exacerbation    Comorbidities OA, previous sx on L knee    Examination-Activity Limitations Bathing;Squat;Lift;Stairs;Bed Mobility;Bend;Locomotion Level;Stand;Carry;Transfers;Sit;Sleep    Examination-Participation  Restrictions Cleaning;Laundry;Community Activity;Driving    Stability/Clinical Decision Making Evolving/Moderate complexity    Clinical Decision Making Moderate    Rehab Potential Good    Clinical Impairments Affecting Rehab Potential Positive: motivation, age, partial replacement, active; Negative: history of L knee problems    PT Frequency 2x / week    PT Duration 8 weeks    PT Treatment/Interventions Aquatic Therapy;Lobbyist  Stimulation;Cryotherapy;Iontophoresis 4mg /ml Dexamethasone;Moist Heat;DME Instruction;Functional mobility training;Therapeutic activities;Neuromuscular re-education;Dry needling;Passive range of motion;Joint Manipulations;Taping;Manual techniques;Therapeutic exercise;Gait training;Stair training;Patient/family education    PT Next Visit Plan Motor control, deep squats, hip flexion strengthening    PT Home Exercise Plan SL bridge, lunge, knee flex stretch, hip flexor stretch    Consulted and Agree with Plan of Care Patient           Patient will benefit from skilled therapeutic intervention in order to improve the following deficits and impairments:  Abnormal gait, Decreased activity tolerance, Decreased coordination, Decreased strength, Increased fascial restricitons, Impaired flexibility, Pain, Postural dysfunction, Improper body mechanics, Decreased range of motion, Difficulty walking, Decreased endurance, Decreased mobility, Increased muscle spasms  Visit Diagnosis: Muscle weakness (generalized)  Chronic pain of left knee  Stiffness of left knee, not elsewhere classified     Problem List Patient Active Problem List   Diagnosis Date Noted  . Arthrofibrosis of total knee replacement ([place]) 12/08/2018  . Asthma, exercise induced 09/20/2014  . Overweight 09/20/2014    Durwin Reges DPT Chinita Greenland, SPT Durwin Reges 12/10/2019, 2:53 PM  [place] [facility] [address],  [place], [address] Phone: [phone]   Fax:  [phone]  Name: Traci Drake MRN: [id number] Date of Birth: [birth year]

## 2019-12-10 ENCOUNTER — Encounter: Payer: Self-pay | Admitting: Physical Therapy

## 2019-12-14 ENCOUNTER — Encounter: Payer: Self-pay | Admitting: Physical Therapy

## 2019-12-14 ENCOUNTER — Other Ambulatory Visit: Payer: Self-pay

## 2019-12-14 ENCOUNTER — Ambulatory Visit: Admitting: Physical Therapy

## 2019-12-14 DIAGNOSIS — M25562 Pain in left knee: Secondary | ICD-10-CM

## 2019-12-14 DIAGNOSIS — M6281 Muscle weakness (generalized): Secondary | ICD-10-CM | POA: Diagnosis not present

## 2019-12-14 DIAGNOSIS — M25662 Stiffness of left knee, not elsewhere classified: Secondary | ICD-10-CM

## 2019-12-14 NOTE — Therapy (Signed)
[place] [facility] [address] [place], [address] Phone: [phone]   Fax:  [phone]  Physical Therapy Treatment  Patient Details  Name: Traci Drake MRN: [id number] Date of Birth: 03-15-1956 No data recorded  Encounter Date: 12/14/2019   PT End of Session - 12/14/19 0739    Visit Number 7    Number of Visits 17    Date for PT Re-Evaluation 01/01/20    PT Start Time 0735    PT Stop Time 0815    PT Time Calculation (min) 40 min    Activity Tolerance Patient tolerated treatment well;No increased pain    Behavior During Therapy WFL for tasks assessed/performed           Past Medical History:  Diagnosis Date  . Asthma    seasonal  . Complication of anesthesia    needed albumin in PACU. and had vertigo  . Decreased libido without sexual dysfunction   . Menopause     Past Surgical History:  Procedure Laterality Date  . BREAST BIOPSY  2003  . KNEE ARTHROPLASTY Right 11/2016   Partial   . KNEE ARTHROSCOPY Left    6-8 years ago  . KNEE CLOSED REDUCTION Left 11/02/2019   Procedure: CLOSED MANIPULATION KNEE;  Surgeon: Ollen Gross, MD;  [facility]: [facility];  Service: Orthopedics;  Laterality: Left;   . TOTAL KNEE ARTHROPLASTY Left 12/08/2018   Procedure: TOTAL KNEE ARTHROPLASTY;  Surgeon: Ollen Gross, MD;  Location: [facility];  Service: Orthopedics;  Laterality: Left;    There were no vitals filed for this visit.   Subjective Assessment - 12/14/19 0736    Subjective Pt reports going to the gym over the weekend and is experiencing some back pain after using the leg press.  Pt reports 3/10 pain and stiffness in her L knee this morning.    Pertinent History Patient s/p L TKA 12/08/18 with successful rehab here, discharge with full ROM and strength 03/10/19. Reports in Jan 2021 she felt like her hamstring started getting very tight and she lost motion of the knee. She returned to Dr. Despina Hick,  reporting she only had about 95d of flex and he completed manipluation under anesthesia 11/02/19, with 135d flexion gained. She reports that she felt like she had a lot of motion following procedure, but that it has stiffened up since then. 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 Reports difficulty with step negotation over things in the yard (as she likes to garden), squatting and bending. Reports minimal pain, other than 2/10 pain today following procedure Monday. Pt denies N/V, B&B changes, unexplained weight fluctuation, saddle paresthesia, fever, night sweats, or unrelenting night pain at this time.    Limitations House hold activities;Standing;Lifting    How long can you sit comfortably? unlmited    How long can you stand comfortably? unlimited    How long can you walk comfortably? unlimited    Diagnostic tests none since TKA    Patient Stated Goals Increased motion and strength    Currently in Pain? Yes    Pain Score 3     Pain Onset More than a month ago            ThereEx Recumbent bike seat 8 L3 for low grade strengthening Step up onto 12" step 3x10 with cueing for reduced RLE push off for emphasis on concentric use of  LLE for step up; cueing to reduce circumduction and improve motor control with increasing knee flexion to clear objects Stepping over 12.25" object 2x15 for improved motor control with stepping over objects Omega leg press #35 3x10  Manual Therapy Seated combined traction and posterior tib mobilization grade 3-4 30sec bouts x6 with 120d flexion obtained following Dry Needling: (2) 30mm .30 needles placed along the L lateral quadricep to decrease increased muscular spasms and trigger points with the patient positioned in supine. Patient was educated on risks and benefits of therapy and verbally consents to PT.       PT Education - 12/14/19 0739    Education provided Yes    Education  Details Therex form/mechanics; leg press mechanics for gym    Person(s) Educated Patient    Methods Explanation;Demonstration;Verbal cues    Comprehension Verbalized understanding;Returned demonstration            PT Short Term Goals - 11/05/19 1048      PT SHORT TERM GOAL #1   Title Pt will be independent with HEP in order to decrease ankle pain and increase strength in order to improve pain-free function at home and work.    Baseline 11/05/19 HEP given    Time 4    Period Weeks    Status New      PT SHORT TERM GOAL #2   Title Patient will demonstrate PROM WNL in order to demonstrate adequate joint motion for ADLs    Baseline 11/05/19 0d - 119d    Time 4    Period Weeks    Status New             PT Long Term Goals - 11/05/19 1048      PT LONG TERM GOAL #1   Title Patient will increase FOTO score to 67 to demonstrate predicted increase in functional mobility to complete ADLs    Baseline 11/05/19 60    Time 8    Period Weeks      PT LONG TERM GOAL #2   Title Patient will demonstrate 1RM of L hamstring curl of 35# to demonstrate symmetry of RLE and increased knee flex strength    Baseline 11/05/19 25#    Time 8    Period Weeks    Status New      PT LONG TERM GOAL #3   Title Patient will demonstrate knee AROM WNL in order to complete yard work and stair negotiation    Baseline 11/05/19 -4d - 105d    Time 8    Period Weeks    Status New                 Plan - 12/14/19 6063    Clinical Impression Statement PT continued therex progression of hip/knee strength and motor control.  Pt able to step up and over a 12" step with appropriate foot clearance, and pt able to step up with improved motor control of LLE with minimal cueing.  Pt with 120d of knee flexion with PT assisted PROM.  Pt with AROM < PROM.  PT will continue progression as able.    Personal Factors and Comorbidities Age;Comorbidity 1;Past/Current Experience;Sex;Time since onset of  injury/illness/exacerbation    Comorbidities OA, previous sx on L knee    Examination-Activity Limitations Bathing;Squat;Lift;Stairs;Bed Mobility;Bend;Locomotion Level;Stand;Carry;Transfers;Sit;Sleep    Examination-Participation Restrictions Cleaning;Laundry;Community Activity;Driving    Stability/Clinical Decision Making Evolving/Moderate complexity    Clinical Decision Making Moderate    Rehab Potential Good    Clinical Impairments  Affecting Rehab Potential Positive: motivation, age, partial replacement, active; Negative: history of L knee problems    PT Frequency 2x / week    PT Duration 8 weeks    PT Treatment/Interventions Aquatic Therapy;Electrical Stimulation;Cryotherapy;Iontophoresis 4mg /ml Dexamethasone;Moist Heat;DME Instruction;Functional mobility training;Therapeutic activities;Neuromuscular re-education;Dry needling;Passive range of motion;Joint Manipulations;Taping;Manual techniques;Therapeutic exercise;Gait training;Stair training;Patient/family education    PT Next Visit Plan Motor control, deep squats, hip flexion strengthening    PT Home Exercise Plan SL bridge, lunge, knee flex stretch, hip flexor stretch    Consulted and Agree with Plan of Care Patient           Patient will benefit from skilled therapeutic intervention in order to improve the following deficits and impairments:  Abnormal gait, Decreased activity tolerance, Decreased coordination, Decreased strength, Increased fascial restricitons, Impaired flexibility, Pain, Postural dysfunction, Improper body mechanics, Decreased range of motion, Difficulty walking, Decreased endurance, Decreased mobility, Increased muscle spasms  Visit Diagnosis: Muscle weakness (generalized)  Chronic pain of left knee  Stiffness of left knee, not elsewhere classified     Problem List Patient Active Problem List   Diagnosis Date Noted  . Arthrofibrosis of total knee replacement ([facility]) 12/08/2018  . Asthma, exercise induced  09/20/2014  . Overweight 09/20/2014    11/20/2014 DPT Hilda Lias, SPT Ricarda Frame 12/14/2019, 8:59 AM  [facility]TS [facility]IC[facility] [address], [address], [place] Phone: [phone]   Fax:  [phone]  Name: Traci Drake MRN: Stevie Kern Date of Birth: [birth year]

## 2019-12-15 ENCOUNTER — Encounter: Admitting: Physical Therapy

## 2019-12-17 ENCOUNTER — Encounter: Admitting: Physical Therapy

## 2019-12-22 ENCOUNTER — Ambulatory Visit: Admitting: Physical Therapy

## 2019-12-24 ENCOUNTER — Other Ambulatory Visit: Payer: Self-pay

## 2019-12-24 ENCOUNTER — Ambulatory Visit: Attending: Orthopedic Surgery | Admitting: Physical Therapy

## 2019-12-24 ENCOUNTER — Encounter: Payer: Self-pay | Admitting: Physical Therapy

## 2019-12-24 DIAGNOSIS — M6281 Muscle weakness (generalized): Secondary | ICD-10-CM | POA: Insufficient documentation

## 2019-12-24 DIAGNOSIS — G8929 Other chronic pain: Secondary | ICD-10-CM | POA: Insufficient documentation

## 2019-12-24 DIAGNOSIS — M25662 Stiffness of left knee, not elsewhere classified: Secondary | ICD-10-CM | POA: Diagnosis present

## 2019-12-24 DIAGNOSIS — M25562 Pain in left knee: Secondary | ICD-10-CM | POA: Insufficient documentation

## 2019-12-24 NOTE — Therapy (Signed)
[facility] [address], [place], [address] Phone: [phone]   Fax:  [phone]  Physical Therapy Treatment  Patient Details  Name: Traci Drake MRN: [id number] Date of Birth: 01-04-1956 No data recorded  Encounter Date: 12/24/2019   PT End of Session - 12/24/19 0906    Visit Number 8    Number of Visits 17    Date for PT Re-Evaluation 01/01/20    PT Start Time 0900    PT Stop Time 0945    PT Time Calculation (min) 45 min    Activity Tolerance Patient tolerated treatment well;No increased pain    Behavior During Therapy WFL for tasks assessed/performed           Past Medical History:  Diagnosis Date  . Asthma    seasonal  . Complication of anesthesia    needed albumin in PACU. and had vertigo  . Decreased libido without sexual dysfunction   . Menopause     Past Surgical History:  Procedure Laterality Date  . BREAST BIOPSY  2003  . KNEE ARTHROPLASTY Right 11/2016   Partial   . KNEE ARTHROSCOPY Left    6-8 years ago  . KNEE CLOSED REDUCTION Left 11/02/2019   Procedure: CLOSED MANIPULATION KNEE;  Surgeon: Ollen Gross, MD;  Location: [facility];  Service: Orthopedics;  Laterality: Left;   . TOTAL KNEE ARTHROPLASTY Left 12/08/2018   Procedure: TOTAL KNEE ARTHROPLASTY;  Surgeon: Ollen Gross, MD;  Location: [facility];  Service: Orthopedics;  Laterality: Left;    There were no vitals filed for this visit.   Subjective Assessment - 12/24/19 0902    Subjective Pt reports 3/10 pain and stiffness in her L knee this morning.  Some compliance with HEP, was on vacation and did 1-2 miles of walking daily over the weekend.    Pertinent History Patient s/p L TKA 12/08/18 with successful rehab here, discharge with full ROM and strength 03/10/19. Reports in Jan 2021 she felt like her hamstring started getting very tight and she lost motion of the knee. She returned to Dr. Despina Hick, reporting she only  had about 95d of flex and he completed manipluation under anesthesia 11/02/19, with 135d flexion gained. She reports that she felt like she had a lot of motion following procedure, but that it has stiffened up since then. 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 Reports difficulty with step negotation over things in the yard (as she likes to garden), squatting and bending. Reports minimal pain, other than 2/10 pain today following procedure Monday. Pt denies N/V, B&B changes, unexplained weight fluctuation, saddle paresthesia, fever, night sweats, or unrelenting night pain at this time.    Limitations House hold activities;Standing;Lifting    How long can you sit comfortably? unlmited    How long can you stand comfortably? unlimited    How long can you walk comfortably? unlimited    Diagnostic tests none since TKA    Patient Stated Goals Increased motion and strength    Currently in Pain? Yes    Pain Score 3     Pain Location Knee    Pain Orientation Left    Pain Onset More than a month ago           ThereEx Recumbent bike seat 8 L3 for low grade strengthening Lateral step up onto 8" step 3x10 with cueing for reduced  RLE push off and emphasis on eccentric control Step up onto 12" step x10 Stepping over 12.25" object 2x15 for improved motor control with stepping over objects Step up and over 12" step and over 12.25" object x15 for improved motor control with stepping up and over  Deep squat with TRX x10 SL squat with TRX 2x10 Omega hamstring curl #25 2x10 Omega quad extension #10 2x10 with cueing for eccentric control    Manual Therapy Seated combined traction and posterior tib mobilization grade 3-4 30sec bouts x6     PT Education - 12/24/19 0906    Education provided Yes    Education Details Therex form/mechanics    Person(s) Educated Patient    Methods Explanation;Demonstration;Verbal cues     Comprehension Verbalized understanding;Returned demonstration            PT Short Term Goals - 11/05/19 1048      PT SHORT TERM GOAL #1   Title Pt will be independent with HEP in order to decrease ankle pain and increase strength in order to improve pain-free function at home and work.    Baseline 11/05/19 HEP given    Time 4    Period Weeks    Status New      PT SHORT TERM GOAL #2   Title Patient will demonstrate PROM WNL in order to demonstrate adequate joint motion for ADLs    Baseline 11/05/19 0d - 119d    Time 4    Period Weeks    Status New             PT Long Term Goals - 11/05/19 1048      PT LONG TERM GOAL #1   Title Patient will increase FOTO score to 67 to demonstrate predicted increase in functional mobility to complete ADLs    Baseline 11/05/19 60    Time 8    Period Weeks      PT LONG TERM GOAL #2   Title Patient will demonstrate 1RM of L hamstring curl of 35# to demonstrate symmetry of RLE and increased knee flex strength    Baseline 11/05/19 25#    Time 8    Period Weeks    Status New      PT LONG TERM GOAL #3   Title Patient will demonstrate knee AROM WNL in order to complete yard work and stair negotiation    Baseline 11/05/19 -4d - 105d    Time 8    Period Weeks    Status New                 Plan - 12/24/19 0947    Clinical Impression Statement PT continued therex progression of hip/knee strength and motor control.  Pt able to achieve foot clearance over 12" object with appropriate motor control.  Pt with improvements in LLE strength, with deficits remaining in quad strength and proper eccentric control with step downs.  PT will continue progression as able.    Personal Factors and Comorbidities Age;Comorbidity 1;Past/Current Experience;Sex;Time since onset of injury/illness/exacerbation    Comorbidities OA, previous sx on L knee    Examination-Activity Limitations Bathing;Squat;Lift;Stairs;Bed Mobility;Bend;Locomotion  Level;Stand;Carry;Transfers;Sit;Sleep    Examination-Participation Restrictions Cleaning;Laundry;Community Activity;Driving    Stability/Clinical Decision Making Evolving/Moderate complexity    Clinical Decision Making Moderate    Rehab Potential Good    Clinical Impairments Affecting Rehab Potential Positive: motivation, age, partial replacement, active; Negative: history of L knee problems    PT Frequency 2x / week    PT  Duration 8 weeks    PT Treatment/Interventions Aquatic Therapy;Electrical Stimulation;Cryotherapy;Iontophoresis 4mg /ml Dexamethasone;Moist Heat;DME Instruction;Functional mobility training;Therapeutic activities;Neuromuscular re-education;Dry needling;Passive range of motion;Joint Manipulations;Taping;Manual techniques;Therapeutic exercise;Gait training;Stair training;Patient/family education    PT Next Visit Plan Motor control, deep squats, hip flexion strengthening, quad strengthening    PT Home Exercise Plan SL bridge, lunge, knee flex stretch, hip flexor stretch    Consulted and Agree with Plan of Care Patient           Patient will benefit from skilled therapeutic intervention in order to improve the following deficits and impairments:  Abnormal gait, Decreased activity tolerance, Decreased coordination, Decreased strength, Increased fascial restricitons, Impaired flexibility, Pain, Postural dysfunction, Improper body mechanics, Decreased range of motion, Difficulty walking, Decreased endurance, Decreased mobility, Increased muscle spasms  Visit Diagnosis: Muscle weakness (generalized)  Chronic pain of left knee  Stiffness of left knee, not elsewhere classified     Problem List Patient Active Problem List   Diagnosis Date Noted  . Arthrofibrosis of total knee replacement ([facility]) 12/08/2018  . Asthma, exercise induced 09/20/2014  . Overweight 09/20/2014   11/20/2014 DPT Hilda Lias, SPT Ricarda Frame 12/24/2019, 1:41 PM  [facility]  [facility] PHYS[facility] AND SPORTS MEDICINE [address], [address], [place] Phone: [phone]   Fax:  [phone]  Name: Traci Drake MRN: Stevie Kern Date of Birth: 10/10/1955

## 2019-12-28 ENCOUNTER — Ambulatory Visit: Admitting: Physical Therapy

## 2019-12-29 ENCOUNTER — Ambulatory Visit: Admitting: Physical Therapy

## 2019-12-30 ENCOUNTER — Ambulatory Visit: Admitting: Physical Therapy

## 2019-12-31 ENCOUNTER — Ambulatory Visit: Admitting: Physical Therapy

## 2019-12-31 ENCOUNTER — Other Ambulatory Visit: Payer: Self-pay

## 2019-12-31 ENCOUNTER — Encounter: Payer: Self-pay | Admitting: Physical Therapy

## 2019-12-31 DIAGNOSIS — M6281 Muscle weakness (generalized): Secondary | ICD-10-CM | POA: Diagnosis not present

## 2019-12-31 DIAGNOSIS — M25562 Pain in left knee: Secondary | ICD-10-CM

## 2019-12-31 DIAGNOSIS — M25662 Stiffness of left knee, not elsewhere classified: Secondary | ICD-10-CM

## 2019-12-31 NOTE — Therapy (Signed)
[facility] [address], [place], [address] Phone: [phone]   Fax:  [phone]  Physical Therapy Treatment  Patient Details  Name: Traci Drake MRN: [id number] Date of Birth: [birth year] No data recorded  Encounter Date: 12/31/2019   PT End of Session - 12/31/19 1351    Visit Number 9    Number of Visits 17    Date for PT Re-Evaluation 01/01/20    PT Start Time 1345    PT Stop Time 1425    PT Time Calculation (min) 40 min    Activity Tolerance Patient tolerated treatment well;No increased pain    Behavior During Therapy WFL for tasks assessed/performed           Past Medical History:  Diagnosis Date  . Asthma    seasonal  . Complication of anesthesia    needed albumin in PACU. and had vertigo  . Decreased libido without sexual dysfunction   . Menopause     Past Surgical History:  Procedure Laterality Date  . BREAST BIOPSY  2003  . KNEE ARTHROPLASTY Right 11/2016   Partial   . KNEE ARTHROSCOPY Left    6-8 years ago  . KNEE CLOSED REDUCTION Left 11/02/2019   Procedure: CLOSED MANIPULATION KNEE;  Surgeon: Ollen Gross, MD;  Location: [facility];  Service: Orthopedics;  Laterality: Left;   . TOTAL KNEE ARTHROPLASTY Left 12/08/2018   Procedure: TOTAL KNEE ARTHROPLASTY;  Surgeon: Ollen Gross, MD;  Location: [facility];  Service: Orthopedics;  Laterality: Left;    There were no vitals filed for this visit.   Subjective Assessment - 12/31/19 1350    Subjective Pt reports no pain today.  Compliance with HEP.    Pertinent History Patient s/p L TKA 12/08/18 with successful rehab here, discharge with full ROM and strength 03/10/19. Reports in Jan 2021 she felt like her hamstring started getting very tight and she lost motion of the knee. She returned to Dr. Despina Hick, reporting she only had about 95d of flex and he completed manipluation under anesthesia 11/02/19, with 135d flexion gained. She  reports that she felt like she had a lot of motion following procedure, but that it has stiffened up since then. 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 Reports difficulty with step negotation over things in the yard (as she likes to garden), squatting and bending. Reports minimal pain, other than 2/10 pain today following procedure Monday. Pt denies N/V, B&B changes, unexplained weight fluctuation, saddle paresthesia, fever, night sweats, or unrelenting night pain at this time.    Limitations House hold activities;Standing;Lifting    How long can you sit comfortably? unlmited    How long can you stand comfortably? unlimited    How long can you walk comfortably? unlimited    Diagnostic tests none since TKA    Patient Stated Goals Increased motion and strength    Currently in Pain? No/denies    Pain Onset More than a month ago           TherEx Recumbent bike seat 8 L3 for low grade strengthening Lateral step up onto 8" step LLE 3x8 with cueing for light R heel touch only and full hip extension at top Straight leg dead lift x10, 2x8 with 15# dumbbells with cueing for flat back and glute activation Step up onto 12" step LLE x10 with 4# ankle weight with  cueing to decrease circumduction and increase hip flexion for foot clearance Walking with butt kicks with 4# ankle weights B 2x35ft Butt kicks with increased velocity, pt unable to perform fluently d/t L knee pain with landing on LLE, pain subsides with activity cessation Standing hamstring curl with emphasis on increased speed for power 3x20  Manual Therapy Seated L knee flexion PROM with distraction 5x30 second hold, able to reach ~120d    PT Education - 12/31/19 1351    Education provided Yes    Education Details Therex form/mechanics    Person(s) Educated Patient    Methods Explanation;Demonstration;Verbal cues    Comprehension Verbalized  understanding;Returned demonstration            PT Short Term Goals - 11/05/19 1048      PT SHORT TERM GOAL #1   Title Pt will be independent with HEP in order to decrease ankle pain and increase strength in order to improve pain-free function at home and work.    Baseline 11/05/19 HEP given    Time 4    Period Weeks    Status New      PT SHORT TERM GOAL #2   Title Patient will demonstrate PROM WNL in order to demonstrate adequate joint motion for ADLs    Baseline 11/05/19 0d - 119d    Time 4    Period Weeks    Status New             PT Long Term Goals - 11/05/19 1048      PT LONG TERM GOAL #1   Title Patient will increase FOTO score to 67 to demonstrate predicted increase in functional mobility to complete ADLs    Baseline 11/05/19 60    Time 8    Period Weeks      PT LONG TERM GOAL #2   Title Patient will demonstrate 1RM of L hamstring curl of 35# to demonstrate symmetry of RLE and increased knee flex strength    Baseline 11/05/19 25#    Time 8    Period Weeks    Status New      PT LONG TERM GOAL #3   Title Patient will demonstrate knee AROM WNL in order to complete yard work and stair negotiation    Baseline 11/05/19 -4d - 105d    Time 8    Period Weeks    Status New                 Plan - 12/31/19 1620    Clinical Impression Statement PT continued therex progression of hip/knee strength and motor control with good success.  Pt requires cueing to minimize circumduction of LE for foot clearance over 12" with good carry over.  Pt has pain in L lateral knee with high velocity hamstring curls and butt kicks; decreased pain with static open chain movement and pain subsides with activity cessation.  Pt able to reach 120d of passive knee flexion with limitations remaining with AROM.  PT will continue progression as able.    Personal Factors and Comorbidities Age;Comorbidity 1;Past/Current Experience;Sex;Time since onset of injury/illness/exacerbation     Comorbidities OA, previous sx on L knee    Examination-Activity Limitations Bathing;Squat;Lift;Stairs;Bed Mobility;Bend;Locomotion Level;Stand;Carry;Transfers;Sit;Sleep    Examination-Participation Restrictions Cleaning;Laundry;Community Activity;Driving    Stability/Clinical Decision Making Evolving/Moderate complexity    Clinical Decision Making Moderate    Rehab Potential Good    Clinical Impairments Affecting Rehab Potential Positive: motivation, age, partial replacement, active; Negative: history of L knee problems  PT Frequency 2x / week    PT Duration 8 weeks    PT Treatment/Interventions Aquatic Therapy;Electrical Stimulation;Cryotherapy;Iontophoresis 4mg /ml Dexamethasone;Moist Heat;DME Instruction;Functional mobility training;Therapeutic activities;Neuromuscular re-education;Dry needling;Passive range of motion;Joint Manipulations;Taping;Manual techniques;Therapeutic exercise;Gait training;Stair training;Patient/family education    PT Next Visit Plan Hamstring strengthening, deep squats    PT Home Exercise Plan SL bridge, lunge, knee flex stretch, hip flexor stretch    Consulted and Agree with Plan of Care Patient           Patient will benefit from skilled therapeutic intervention in order to improve the following deficits and impairments:  Abnormal gait, Decreased activity tolerance, Decreased coordination, Decreased strength, Increased fascial restricitons, Impaired flexibility, Pain, Postural dysfunction, Improper body mechanics, Decreased range of motion, Difficulty walking, Decreased endurance, Decreased mobility, Increased muscle spasms  Visit Diagnosis: Muscle weakness (generalized)  Chronic pain of left knee  Stiffness of left knee, not elsewhere classified     Problem List Patient Active Problem List   Diagnosis Date Noted  . Arthrofibrosis of total knee replacement (HCC) 12/08/2018  . Asthma, exercise induced 09/20/2014  . Overweight 09/20/2014    11/20/2014 DPT  Hilda Lias, SPT Ricarda Frame 12/31/2019, 5:46 PM  [facility] PHYSICAL AND SPORTS MEDICINE [address], [address], [place] Phone: [phone]   Fax:  [phone]  Name: Traci Drake MRN: Stevie Kern Date of Birth: [birth year]

## 2020-01-04 ENCOUNTER — Other Ambulatory Visit: Payer: Self-pay

## 2020-01-04 ENCOUNTER — Ambulatory Visit: Admitting: Physical Therapy

## 2020-01-04 ENCOUNTER — Encounter: Payer: Self-pay | Admitting: Physical Therapy

## 2020-01-04 DIAGNOSIS — M6281 Muscle weakness (generalized): Secondary | ICD-10-CM | POA: Diagnosis not present

## 2020-01-04 DIAGNOSIS — M25562 Pain in left knee: Secondary | ICD-10-CM

## 2020-01-04 DIAGNOSIS — M25662 Stiffness of left knee, not elsewhere classified: Secondary | ICD-10-CM

## 2020-01-04 DIAGNOSIS — G8929 Other chronic pain: Secondary | ICD-10-CM

## 2020-01-04 NOTE — Therapy (Signed)
[facility] [address], [place], [address] Phone: [phone]   Fax:  [phone]  Physical Therapy Treatment  Patient Details  Name: Traci Drake MRN: [id number] Date of Birth: 08-20-1955 No data recorded  Encounter Date: 01/04/2020   PT End of Session - 01/04/20 0904    Visit Number 10    Number of Visits 17    Date for PT Re-Evaluation 01/01/20    PT Start Time 0905    PT Stop Time 0945    PT Time Calculation (min) 40 min    Activity Tolerance Patient tolerated treatment well;No increased pain    Behavior During Therapy WFL for tasks assessed/performed           Past Medical History:  Diagnosis Date  . Asthma    seasonal  . Complication of anesthesia    needed albumin in PACU. and had vertigo  . Decreased libido without sexual dysfunction   . Menopause     Past Surgical History:  Procedure Laterality Date  . BREAST BIOPSY  2003  . KNEE ARTHROPLASTY Right 11/2016   Partial   . KNEE ARTHROSCOPY Left    6-8 years ago  . KNEE CLOSED REDUCTION Left 11/02/2019   Procedure: CLOSED MANIPULATION KNEE;  Surgeon: Ollen Gross, MD;  Location: [facility];  Service: Orthopedics;  Laterality: Left;   . TOTAL KNEE ARTHROPLASTY Left 12/08/2018   Procedure: TOTAL KNEE ARTHROPLASTY;  Surgeon: Ollen Gross, MD;  Location: [facility];  Service: Orthopedics;  Laterality: Left;    There were no vitals filed for this visit.   Subjective Assessment - 01/04/20 0903    Subjective Pt reports no pain today and compliance with HEP.    Pertinent History Patient s/p L TKA 12/08/18 with successful rehab here, discharge with full ROM and strength 03/10/19. Reports in Jan 2021 she felt like her hamstring started getting very tight and she lost motion of the knee. She returned to Dr. Despina Hick, reporting she only had about 95d of flex and he completed manipluation under anesthesia 11/02/19, with 135d flexion gained. She  reports that she felt like she had a lot of motion following procedure, but that it has stiffened up since then. 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 Reports difficulty with step negotation over things in the yard (as she likes to garden), squatting and bending. Reports minimal pain, other than 2/10 pain today following procedure Monday. Pt denies N/V, B&B changes, unexplained weight fluctuation, saddle paresthesia, fever, night sweats, or unrelenting night pain at this time.    Limitations House hold activities;Standing;Lifting    How long can you sit comfortably? unlmited    How long can you stand comfortably? unlimited    How long can you walk comfortably? unlimited    Diagnostic tests none since TKA    Patient Stated Goals Increased motion and strength    Currently in Pain? No/denies    Pain Onset More than a month ago           TherEx -Recumbent bike seat 8 L3 for low grade strengthening -Lateral step up onto 8" step LLE 3x10 with cueing for light R heel touch only and full hip extension at top; pt with reports of tightness in hamstring -Standing hamstring stretch 2x30 sec between lateral step up reps -Seated hamstring stretch 2x30 sec post lateral step up reps with noted  increase in stretch per pt report -OMEGA SL hamstring curl L 20# x10, #25 2x10 with cueing to reduce knee valgus -Standing hamstring curl with 5# 3x10 with anterior hip on wall as tactile cue to reduce hip flexion -Deep squat with TRX x5 -SL deep squat with TRX 2x10 with cueing for glute activation and decrease knee valgus     PT Education - 01/04/20 0904    Education provided Yes    Education Details Therex from/ mechanics    Person(s) Educated Patient    Methods Explanation;Demonstration;Verbal cues    Comprehension Verbalized understanding;Returned demonstration            PT Short Term Goals - 11/05/19 1048      PT SHORT  TERM GOAL #1   Title Pt will be independent with HEP in order to decrease ankle pain and increase strength in order to improve pain-free function at home and work.    Baseline 11/05/19 HEP given    Time 4    Period Weeks    Status New      PT SHORT TERM GOAL #2   Title Patient will demonstrate PROM WNL in order to demonstrate adequate joint motion for ADLs    Baseline 11/05/19 0d - 119d    Time 4    Period Weeks    Status New             PT Long Term Goals - 11/05/19 1048      PT LONG TERM GOAL #1   Title Patient will increase FOTO score to 67 to demonstrate predicted increase in functional mobility to complete ADLs    Baseline 11/05/19 60    Time 8    Period Weeks      PT LONG TERM GOAL #2   Title Patient will demonstrate 1RM of L hamstring curl of 35# to demonstrate symmetry of RLE and increased knee flex strength    Baseline 11/05/19 25#    Time 8    Period Weeks    Status New      PT LONG TERM GOAL #3   Title Patient will demonstrate knee AROM WNL in order to complete yard work and stair negotiation    Baseline 11/05/19 -4d - 105d    Time 8    Period Weeks    Status New                 Plan - 01/04/20 0910    Clinical Impression Statement PT continued therex progression of hip/knee strength and motor control with good success.  Pt reports tightness in R hamstring with therex with some relief with stretching, no knee pain.  Pt with improved open chain strength of the hamstrings with improvements in functional range; though pt has difficulty with closed chain hamstring strength and control; able to perform in limited ranges with no noted increase in pain.  PT will continue progression as able.    Personal Factors and Comorbidities Age;Comorbidity 1;Past/Current Experience;Sex;Time since onset of injury/illness/exacerbation    Comorbidities OA, previous sx on L knee    Examination-Activity Limitations Bathing;Squat;Lift;Stairs;Bed Mobility;Bend;Locomotion  Level;Stand;Carry;Transfers;Sit;Sleep    Examination-Participation Restrictions Cleaning;Laundry;Community Activity;Driving    Stability/Clinical Decision Making Evolving/Moderate complexity    Clinical Decision Making Moderate    Rehab Potential Good    Clinical Impairments Affecting Rehab Potential Positive: motivation, age, partial replacement, active; Negative: history of L knee problems    PT Frequency 2x / week    PT Duration 8 weeks  PT Treatment/Interventions Aquatic Therapy;Electrical Stimulation;Cryotherapy;Iontophoresis 4mg /ml Dexamethasone;Moist Heat;DME Instruction;Functional mobility training;Therapeutic activities;Neuromuscular re-education;Dry needling;Passive range of motion;Joint Manipulations;Taping;Manual techniques;Therapeutic exercise;Gait training;Stair training;Patient/family education    PT Next Visit Plan SL activities, deep squats, closed chain hamstring strengthening    PT Home Exercise Plan SL bridge, lunge, knee flex stretch, hip flexor stretch    Consulted and Agree with Plan of Care Patient           Patient will benefit from skilled therapeutic intervention in order to improve the following deficits and impairments:  Abnormal gait, Decreased activity tolerance, Decreased coordination, Decreased strength, Increased fascial restricitons, Impaired flexibility, Pain, Postural dysfunction, Improper body mechanics, Decreased range of motion, Difficulty walking, Decreased endurance, Decreased mobility, Increased muscle spasms  Visit Diagnosis: Muscle weakness (generalized)  Chronic pain of left knee  Stiffness of left knee, not elsewhere classified     Problem List Patient Active Problem List   Diagnosis Date Noted  . Arthrofibrosis of total knee replacement (HCC) 12/08/2018  . Asthma, exercise induced 09/20/2014  . Overweight 09/20/2014    11/20/2014 DPT Hilda Lias, SPT Ricarda Frame 01/04/2020, 2:09 PM  [facility]  [facility] PHYSICAL AND SPORTS MEDICINE [address], [address], [place] Phone: [phone]   Fax:  [phone]  Name: Traci Drake MRN: Stevie Kern Date of Birth: December 10, 1955

## 2020-01-06 ENCOUNTER — Encounter: Payer: Self-pay | Admitting: Physical Therapy

## 2020-01-06 ENCOUNTER — Other Ambulatory Visit: Payer: Self-pay

## 2020-01-06 ENCOUNTER — Ambulatory Visit: Admitting: Physical Therapy

## 2020-01-06 DIAGNOSIS — M6281 Muscle weakness (generalized): Secondary | ICD-10-CM | POA: Diagnosis not present

## 2020-01-06 DIAGNOSIS — M25662 Stiffness of left knee, not elsewhere classified: Secondary | ICD-10-CM

## 2020-01-06 DIAGNOSIS — M25562 Pain in left knee: Secondary | ICD-10-CM

## 2020-01-06 NOTE — Therapy (Signed)
[place] [facility] [address] [place], [address] Phone: [phone]   Fax:  [phone]  Physical Therapy Treatment  Patient Details  Name: Traci Drake MRN: [id number] Date of Birth: 1955-07-24 No data recorded  Encounter Date: 01/06/2020   PT End of Session - 01/06/20 1304    Visit Number 11    Number of Visits 17    Date for PT Re-Evaluation 01/01/20    PT Start Time 1300    PT Stop Time 1340    PT Time Calculation (min) 40 min    Activity Tolerance Patient tolerated treatment well;No increased pain    Behavior During Therapy WFL for tasks assessed/performed           Past Medical History:  Diagnosis Date  . Asthma    seasonal  . Complication of anesthesia    needed albumin in PACU. and had vertigo  . Decreased libido without sexual dysfunction   . Menopause     Past Surgical History:  Procedure Laterality Date  . BREAST BIOPSY  2003  . KNEE ARTHROPLASTY Right 11/2016   Partial   . KNEE ARTHROSCOPY Left    6-8 years ago  . KNEE CLOSED REDUCTION Left 11/02/2019   Procedure: CLOSED MANIPULATION KNEE;  Surgeon: Ollen Gross, MD;  Location: [facility];  Service: Orthopedics;  Laterality: Left;   . TOTAL KNEE ARTHROPLASTY Left 12/08/2018   Procedure: TOTAL KNEE ARTHROPLASTY;  Surgeon: Ollen Gross, MD;  Location: [facility];  Service: Orthopedics;  Laterality: Left;    There were no vitals filed for this visit.   Subjective Assessment - 01/06/20 1302    Subjective Pt reports no pain today, just stiffness.  Some compliance with HEP, reports being very busy with outdoor activities and walking.  Pt reports improved ability to step over objects outside, still has to think about it but is able to clear.    Pertinent History Patient s/p L TKA 12/08/18 with successful rehab here, discharge with full ROM and strength 03/10/19. Reports in Jan 2021 she felt like her hamstring started getting very tight  and she lost motion of the knee. She returned to Dr. Despina Hick, reporting she only had about 95d of flex and he completed manipluation under anesthesia 11/02/19, with 135d flexion gained. She reports that she felt like she had a lot of motion following procedure, but that it has stiffened up since then. 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 Reports difficulty with step negotation over things in the yard (as she likes to garden), squatting and bending. Reports minimal pain, other than 2/10 pain today following procedure Monday. Pt denies N/V, B&B changes, unexplained weight fluctuation, saddle paresthesia, fever, night sweats, or unrelenting night pain at this time.    Limitations House hold activities;Standing;Lifting    How long can you sit comfortably? unlmited    How long can you stand comfortably? unlimited    How long can you walk comfortably? unlimited    Diagnostic tests none since TKA    Patient Stated Goals Increased motion and strength    Currently in Pain? No/denies    Pain Onset More than a month ago           TherEx -Recumbent bike seat 8 L3 for low grade strengthening -Squat x10 with cueing for increased hip hinge to reduce knee flexion past toes -SL squat LLE modified  with R heel touch 2x10 with cueing for hip hinge and glute activation -Lateral step up onto 8" step LLE 2x10 with good carry over of hip hinge from SL squat -L reverse lunge 3x10 with cueing for glute activation and push off to return to standing position -OMEGA SL hamstring curl L #25 2x10 with cueing to reduce knee valgus -Step over 14" object and step up/down 12" step with cueing for limited LLE circumduction -SL deadlift x8, with 7# dummbell x8 -Seated hamstring stretch 2x30 sec  Manual Therapy Seated L knee flexion PROM with distraction 5x30 second hold, able to reach at least 120d     PT Education - 01/06/20 1304    Education  provided Yes    Education Details Therex form/ Administrator) Educated Patient    Methods Demonstration;Explanation;Verbal cues    Comprehension Verbalized understanding;Returned demonstration            PT Short Term Goals - 11/05/19 1048      PT SHORT TERM GOAL #1   Title Pt will be independent with HEP in order to decrease ankle pain and increase strength in order to improve pain-free function at home and work.    Baseline 11/05/19 HEP given    Time 4    Period Weeks    Status New      PT SHORT TERM GOAL #2   Title Patient will demonstrate PROM WNL in order to demonstrate adequate joint motion for ADLs    Baseline 11/05/19 0d - 119d    Time 4    Period Weeks    Status New             PT Long Term Goals - 11/05/19 1048      PT LONG TERM GOAL #1   Title Patient will increase FOTO score to 67 to demonstrate predicted increase in functional mobility to complete ADLs    Baseline 11/05/19 60    Time 8    Period Weeks      PT LONG TERM GOAL #2   Title Patient will demonstrate 1RM of L hamstring curl of 35# to demonstrate symmetry of RLE and increased knee flex strength    Baseline 11/05/19 25#    Time 8    Period Weeks    Status New      PT LONG TERM GOAL #3   Title Patient will demonstrate knee AROM WNL in order to complete yard work and stair negotiation    Baseline 11/05/19 -4d - 105d    Time 8    Period Weeks    Status New                 Plan - 01/06/20 1305    Clinical Impression Statement PT continued therex progression of hip/knee strength and motor control with good success.  Pt required refresher education on squat mechanics for increased hip hinge and reduced knee flexion over toes with good carry over into SL therex.  Pt with improved motor control with stepping over objects with appropriate foot clearance.  Pt tolerates knee flexion PROM wtih traction which reportedly eases tight feeling in lateral knee; able to reach >120d.  PT will continue  progression as able.    Personal Factors and Comorbidities Age;Comorbidity 1;Past/Current Experience;Sex;Time since onset of injury/illness/exacerbation    Comorbidities OA, previous sx on L knee    Examination-Activity Limitations Bathing;Squat;Lift;Stairs;Bed Mobility;Bend;Locomotion Level;Stand;Carry;Transfers;Sit;Sleep    Examination-Participation Restrictions Cleaning;Laundry;Community Activity;Driving    Stability/Clinical Decision Making Evolving/Moderate  complexity    Clinical Decision Making Moderate    Rehab Potential Good    Clinical Impairments Affecting Rehab Potential Positive: motivation, age, partial replacement, active; Negative: history of L knee problems    PT Frequency 2x / week    PT Duration 8 weeks    PT Treatment/Interventions Aquatic Therapy;Electrical Stimulation;Cryotherapy;Iontophoresis 4mg /ml Dexamethasone;Moist Heat;DME Instruction;Functional mobility training;Therapeutic activities;Neuromuscular re-education;Dry needling;Passive range of motion;Joint Manipulations;Taping;Manual techniques;Therapeutic exercise;Gait training;Stair training;Patient/family education    PT Next Visit Plan Progress SL activities, deep squats, closed chain hamstring strengthening    PT Home Exercise Plan SL bridge, lunge, knee flex stretch, hip flexor stretch    Consulted and Agree with Plan of Care Patient           Patient will benefit from skilled therapeutic intervention in order to improve the following deficits and impairments:  Abnormal gait, Decreased activity tolerance, Decreased coordination, Decreased strength, Increased fascial restricitons, Impaired flexibility, Pain, Postural dysfunction, Improper body mechanics, Decreased range of motion, Difficulty walking, Decreased endurance, Decreased mobility, Increased muscle spasms  Visit Diagnosis: Muscle weakness (generalized)  Chronic pain of left knee  Stiffness of left knee, not elsewhere classified     Problem  List Patient Active Problem List   Diagnosis Date Noted  . Arthrofibrosis of total knee replacement (HCC) 12/08/2018  . Asthma, exercise induced 09/20/2014  . Overweight 09/20/2014   11/20/2014 DPT Hilda Lias, SPT Ricarda Frame 01/06/2020, 5:07 PM  [place] [facility] PH[facility] SPORTS MEDICINE [address], [address], [place] Phone: [phone]   Fax:  [phone]  Name: RAILEY GLAD MRN: Stevie Kern Date of Birth: 25-Mar-1956

## 2020-01-07 ENCOUNTER — Ambulatory Visit: Admitting: Physical Therapy

## 2020-01-11 ENCOUNTER — Encounter: Payer: Self-pay | Admitting: Physical Therapy

## 2020-01-11 ENCOUNTER — Other Ambulatory Visit: Payer: Self-pay

## 2020-01-11 ENCOUNTER — Ambulatory Visit: Admitting: Physical Therapy

## 2020-01-11 DIAGNOSIS — M25662 Stiffness of left knee, not elsewhere classified: Secondary | ICD-10-CM

## 2020-01-11 DIAGNOSIS — G8929 Other chronic pain: Secondary | ICD-10-CM

## 2020-01-11 DIAGNOSIS — M6281 Muscle weakness (generalized): Secondary | ICD-10-CM

## 2020-01-11 NOTE — Therapy (Signed)
[facility] [address], [place], [address] Phone: [phone]   Fax:  [phone]  Physical Therapy Treatment  Patient Details  Name: Traci Drake MRN: [id number] Date of Birth: Nov 22, 1955 No data recorded  Encounter Date: 01/11/2020   PT End of Session - 01/11/20 1122    Visit Number 12    Number of Visits 17    Date for PT Re-Evaluation 01/01/20    PT Start Time 1118    PT Stop Time 1200    PT Time Calculation (min) 42 min    Activity Tolerance Patient tolerated treatment well;No increased pain    Behavior During Therapy WFL for tasks assessed/performed           Past Medical History:  Diagnosis Date  . Asthma    seasonal  . Complication of anesthesia    needed albumin in PACU. and had vertigo  . Decreased libido without sexual dysfunction   . Menopause     Past Surgical History:  Procedure Laterality Date  . BREAST BIOPSY  2003  . KNEE ARTHROPLASTY Right 11/2016   Partial   . KNEE ARTHROSCOPY Left    6-8 years ago  . KNEE CLOSED REDUCTION Left 11/02/2019   Procedure: CLOSED MANIPULATION KNEE;  Surgeon: Traci Gross, MD;  Location: [facility];  Service: Orthopedics;  Laterality: Left;   . TOTAL KNEE ARTHROPLASTY Left 12/08/2018   Procedure: TOTAL KNEE ARTHROPLASTY;  Surgeon: Traci Gross, MD;  Location: [facility];  Service: Orthopedics;  Laterality: Left;    There were no vitals filed for this visit.   Subjective Assessment - 01/11/20 1120    Subjective Pt reports no pain today, just soreness from weekend gardening.  Some compliance with HEP, has had a very busy, active weekend.  Pt reports she is better able to step over objects in her garden.    Pertinent History Patient s/p L TKA 12/08/18 with successful rehab here, discharge with full ROM and strength 03/10/19. Reports in Jan 2021 she felt like her hamstring started getting very tight and she lost motion of the knee. She  returned to Dr. Despina Drake, reporting she only had about 95d of flex and he completed manipluation under anesthesia 11/02/19, with 135d flexion gained. She reports that she felt like she had a lot of motion following procedure, but that it has stiffened up since then. 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 Reports difficulty with step negotation over things in the yard (as she likes to garden), squatting and bending. Reports minimal pain, other than 2/10 pain today following procedure Monday. Pt denies N/V, B&B changes, unexplained weight fluctuation, saddle paresthesia, fever, night sweats, or unrelenting night pain at this time.    Limitations House hold activities;Standing;Lifting    How long can you sit comfortably? unlmited    How long can you stand comfortably? unlimited    How long can you walk comfortably? unlimited    Diagnostic tests none since TKA    Patient Stated Goals Increased motion and strength    Currently in Pain? No/denies    Pain Onset More than a month ago           TherEx -Nustep seat 8 L4 for low grade strengthening -Floor mobility: floor to stand transfer, pt with difficulty in tall kneeling d/t B knee hypersensitivity to WB, pt with difficulty with deep squat d/t  L knee mobility; pt educated on  -Standing from low seat heights 12" unable to stand without UE or PT assistance, 14" requires momentum and is very difficulty but is able to stand -Sit to stands from 15" seat height 2x8 with cueing for glute activation  -Lateral step ups onto 8" step 2x8 with heel touch on floor and cueing for hip hinge and glute activation, unable to touch heel on floor consistently -Total gym leg press x5, modified SL LLE with RLE touch for aid with initiation of stand from deep squat position x5  Manual Therapy Seated L knee flexion PROM with distraction 5x30 second hold, able to reach at least 120d    PT  Education - 01/11/20 1122    Education provided Yes    Education Details Therex form/ Administrator) Educated Patient    Methods Explanation;Demonstration;Verbal cues    Comprehension Verbalized understanding;Returned demonstration            PT Short Term Goals - 11/05/19 1048      PT SHORT TERM GOAL #1   Title Pt will be independent with HEP in order to decrease ankle pain and increase strength in order to improve pain-free function at home and work.    Baseline 11/05/19 HEP given    Time 4    Period Weeks    Status New      PT SHORT TERM GOAL #2   Title Patient will demonstrate PROM WNL in order to demonstrate adequate joint motion for ADLs    Baseline 11/05/19 0d - 119d    Time 4    Period Weeks    Status New             PT Long Term Goals - 11/05/19 1048      PT LONG TERM GOAL #1   Title Patient will increase FOTO score to 67 to demonstrate predicted increase in functional mobility to complete ADLs    Baseline 11/05/19 60    Time 8    Period Weeks      PT LONG TERM GOAL #2   Title Patient will demonstrate 1RM of L hamstring curl of 35# to demonstrate symmetry of RLE and increased knee flex strength    Baseline 11/05/19 25#    Time 8    Period Weeks    Status New      PT LONG TERM GOAL #3   Title Patient will demonstrate knee AROM WNL in order to complete yard work and stair negotiation    Baseline 11/05/19 -4d - 105d    Time 8    Period Weeks    Status New                 Plan - 01/11/20 1300    Clinical Impression Statement PT continued progression of hip/knee strength and motor control with good success.  Pt with difficulty with deep squats of greater than 90d of hip/knee flexion, indicating quad and glute weakness.  Pt requires cueing for hip hinge and glute activation for squat activities.  Pt with no increase in pain throughout session and supervision for safety.  PT will continue progression as able.    Personal Factors and Comorbidities  Age;Comorbidity 1;Past/Current Experience;Sex;Time since onset of injury/illness/exacerbation    Comorbidities OA, previous sx on L knee    Examination-Activity Limitations Bathing;Squat;Lift;Stairs;Bed Mobility;Bend;Locomotion Level;Stand;Carry;Transfers;Sit;Sleep    Examination-Participation Restrictions Cleaning;Laundry;Community Activity;Driving    Stability/Clinical Decision Making Evolving/Moderate complexity    Clinical Decision Making Moderate  Rehab Potential Good    Clinical Impairments Affecting Rehab Potential Positive: motivation, age, partial replacement, active; Negative: history of L knee problems    PT Frequency 2x / week    PT Duration 8 weeks    PT Treatment/Interventions Aquatic Therapy;Electrical Stimulation;Cryotherapy;Iontophoresis 4mg /ml Dexamethasone;Moist Heat;DME Instruction;Functional mobility training;Therapeutic activities;Neuromuscular re-education;Dry needling;Passive range of motion;Joint Manipulations;Taping;Manual techniques;Therapeutic exercise;Gait training;Stair training;Patient/family education    PT Next Visit Plan Progress SL activities, deep squats, closed chain hamstring strengthening    PT Home Exercise Plan SL bridge, lunge, knee flex stretch, hip flexor stretch    Consulted and Agree with Plan of Care Patient           Patient will benefit from skilled therapeutic intervention in order to improve the following deficits and impairments:  Abnormal gait, Decreased activity tolerance, Decreased coordination, Decreased strength, Increased fascial restricitons, Impaired flexibility, Pain, Postural dysfunction, Improper body mechanics, Decreased range of motion, Difficulty walking, Decreased endurance, Decreased mobility, Increased muscle spasms  Visit Diagnosis: Muscle weakness (generalized)  Chronic pain of left knee  Stiffness of left knee, not elsewhere classified     Problem List Patient Active Problem List   Diagnosis Date Noted  .  Arthrofibrosis of total knee replacement (HCC) 12/08/2018  . Asthma, exercise induced 09/20/2014  . Overweight 09/20/2014    11/20/2014 DPT Hilda Lias, SPT Ricarda Frame 01/11/2020, 1:47 PM  [place] [facility] PHYS[facility] AND SPORTS MEDICINE [address], [address], [place] Phone: [phone]   Fax:  [phone]  Name: CAGNEY STEENSON MRN: Stevie Kern Date of Birth: [birth year]

## 2020-01-14 ENCOUNTER — Ambulatory Visit: Admitting: Physical Therapy

## 2020-01-18 ENCOUNTER — Encounter: Payer: Self-pay | Admitting: Physical Therapy

## 2020-01-18 ENCOUNTER — Ambulatory Visit: Attending: Orthopedic Surgery | Admitting: Physical Therapy

## 2020-01-18 ENCOUNTER — Other Ambulatory Visit: Payer: Self-pay

## 2020-01-18 DIAGNOSIS — G8929 Other chronic pain: Secondary | ICD-10-CM | POA: Diagnosis present

## 2020-01-18 DIAGNOSIS — M6281 Muscle weakness (generalized): Secondary | ICD-10-CM | POA: Insufficient documentation

## 2020-01-18 DIAGNOSIS — M25662 Stiffness of left knee, not elsewhere classified: Secondary | ICD-10-CM | POA: Insufficient documentation

## 2020-01-18 DIAGNOSIS — M25561 Pain in right knee: Secondary | ICD-10-CM | POA: Diagnosis present

## 2020-01-18 DIAGNOSIS — M25562 Pain in left knee: Secondary | ICD-10-CM | POA: Insufficient documentation

## 2020-01-18 NOTE — Therapy (Signed)
[facility] [address] [place], [address] Phone: [phone]   Fax:  [phone]  Physical Therapy Treatment  Patient Details  Name: Traci Drake MRN: [id number] Date of Birth: September 23, 1955 No data recorded  Encounter Date: 01/18/2020   PT End of Session - 01/18/20 1106    Visit Number 13    Number of Visits 17    Date for PT Re-Evaluation 01/01/20    PT Start Time 1030    PT Stop Time 1115    PT Time Calculation (min) 45 min    Activity Tolerance Patient tolerated treatment well;No increased pain    Behavior During Therapy WFL for tasks assessed/performed           Past Medical History:  Diagnosis Date  . Asthma    seasonal  . Complication of anesthesia    needed albumin in PACU. and had vertigo  . Decreased libido without sexual dysfunction   . Menopause     Past Surgical History:  Procedure Laterality Date  . BREAST BIOPSY  2003  . KNEE ARTHROPLASTY Right 11/2016   Partial   . KNEE ARTHROSCOPY Left    6-8 years ago  . KNEE CLOSED REDUCTION Left 11/02/2019   Procedure: CLOSED MANIPULATION KNEE;  Surgeon: Ollen Gross, MD;  Location: [facility];  Service: Orthopedics;  Laterality: Left;   . TOTAL KNEE ARTHROPLASTY Left 12/08/2018   Procedure: TOTAL KNEE ARTHROPLASTY;  Surgeon: Ollen Gross, MD;  Location: [facility];  Service: Orthopedics;  Laterality: Left;    There were no vitals filed for this visit.   Subjective Assessment - 01/18/20 1051    Subjective Pt reports no pain today. Reports she is noticing better foot clearance with gardening, and is doing better overall.    Pertinent History Patient s/p L TKA 12/08/18 with successful rehab here, discharge with full ROM and strength 03/10/19. Reports in Jan 2021 she felt like her hamstring started getting very tight and she lost motion of the knee. She returned to Dr. Despina Hick, reporting she only had about 95d of flex and he completed  manipluation under anesthesia 11/02/19, with 135d flexion gained. She reports that she felt like she had a lot of motion following procedure, but that it has stiffened up since then. 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 Reports difficulty with step negotation over things in the yard (as she likes to garden), squatting and bending. Reports minimal pain, other than 2/10 pain today following procedure Monday. Pt denies N/V, B&B changes, unexplained weight fluctuation, saddle paresthesia, fever, night sweats, or unrelenting night pain at this time.    Limitations House hold activities;Standing;Lifting    How long can you sit comfortably? unlmited    How long can you stand comfortably? unlimited    How long can you walk comfortably? unlimited    Diagnostic tests none since TKA    Patient Stated Goals Increased motion and strength             TherEx - Recumbant bike L8 L4 -Total gym leg press SL LLE with RLE touch for aid with initiation of stand from deep squat position 3x 6 - Step up onto 19in 3x 6 with 12in step x5 reps prior for motor control initiation with cuing for hip ext with good carry over - Reverse lunge to hip flexion 2x 10 with min cuing for proper  technique without rotation compensation with good carry over - Hamstring curl 35# x5; 25# 2x 8  Manual Therapy Seated L knee flexion PROM with distraction 10x30 second hold, able to reachat least120d                          PT Education - 01/18/20 1105    Education provided Yes    Education Details therex form/technique    Person(s) Educated Patient    Methods Explanation;Demonstration;Verbal cues    Comprehension Verbalized understanding;Returned demonstration;Verbal cues required            PT Short Term Goals - 11/05/19 1048      PT SHORT TERM GOAL #1   Title Pt will be independent with HEP in order to decrease ankle  pain and increase strength in order to improve pain-free function at home and work.    Baseline 11/05/19 HEP given    Time 4    Period Weeks    Status New      PT SHORT TERM GOAL #2   Title Patient will demonstrate PROM WNL in order to demonstrate adequate joint motion for ADLs    Baseline 11/05/19 0d - 119d    Time 4    Period Weeks    Status New             PT Long Term Goals - 11/05/19 1048      PT LONG TERM GOAL #1   Title Patient will increase FOTO score to 67 to demonstrate predicted increase in functional mobility to complete ADLs    Baseline 11/05/19 60    Time 8    Period Weeks      PT LONG TERM GOAL #2   Title Patient will demonstrate 1RM of L hamstring curl of 35# to demonstrate symmetry of RLE and increased knee flex strength    Baseline 11/05/19 25#    Time 8    Period Weeks    Status New      PT LONG TERM GOAL #3   Title Patient will demonstrate knee AROM WNL in order to complete yard work and stair negotiation    Baseline 11/05/19 -4d - 105d    Time 8    Period Weeks    Status New                 Plan - 01/18/20 1125    Clinical Impression Statement PT continued therex progression for increased hip/knee strength and motor control of functional movements with good success. Patient is demonstrating full knee ROM and much better active motion for object clearance, though she does have some remaining strength deficits. PT and pt discussed re-assessment with possible d/c to gym program for this. PT will re-assess and d/c as able.    Personal Factors and Comorbidities Age;Comorbidity 1;Past/Current Experience;Sex;Time since onset of injury/illness/exacerbation    Comorbidities OA, previous sx on L knee    Examination-Activity Limitations Bathing;Squat;Lift;Stairs;Bed Mobility;Bend;Locomotion Level;Stand;Carry;Transfers;Sit;Sleep    Examination-Participation Restrictions Cleaning;Laundry;Community Activity;Driving    Stability/Clinical Decision Making  Evolving/Moderate complexity    Clinical Decision Making Moderate    Rehab Potential Good    Clinical Impairments Affecting Rehab Potential Positive: motivation, age, partial replacement, active; Negative: history of L knee problems    PT Frequency 2x / week    PT Duration 8 weeks    PT Treatment/Interventions Aquatic Therapy;Electrical Stimulation;Cryotherapy;Iontophoresis 4mg /ml Dexamethasone;Moist Heat;DME Instruction;Functional mobility training;Therapeutic activities;Neuromuscular re-education;Dry needling;Passive range of motion;Joint Manipulations;Taping;Manual techniques;Therapeutic exercise;Gait  training;Stair training;Patient/family education    PT Next Visit Plan Progress SL activities, deep squats, closed chain hamstring strengthening    PT Home Exercise Plan SL bridge, lunge, knee flex stretch, hip flexor stretch    Consulted and Agree with Plan of Care Patient           Patient will benefit from skilled therapeutic intervention in order to improve the following deficits and impairments:  Abnormal gait, Decreased activity tolerance, Decreased coordination, Decreased strength, Increased fascial restricitons, Impaired flexibility, Pain, Postural dysfunction, Improper body mechanics, Decreased range of motion, Difficulty walking, Decreased endurance, Decreased mobility, Increased muscle spasms  Visit Diagnosis: Muscle weakness (generalized)  Chronic pain of left knee  Stiffness of left knee, not elsewhere classified  Acute pain of left knee  Chronic pain of right knee     Problem List Patient Active Problem List   Diagnosis Date Noted  . Arthrofibrosis of total knee replacement (HCC) 12/08/2018  . Asthma, exercise induced 09/20/2014  . Overweight 09/20/2014   Hilda Lias DPT Hilda Lias 01/18/2020, 11:30 AM  [facility] PH[facility] SPORTS [facility]ICINE [address] [place], [address] Phone: [phone]   Fax:   [phone]  Name: Traci Drake MRN: [id number] Date of Birth: 07-22-1955

## 2020-01-21 ENCOUNTER — Encounter: Payer: Self-pay | Admitting: Physical Therapy

## 2020-01-21 ENCOUNTER — Ambulatory Visit: Admitting: Physical Therapy

## 2020-01-21 ENCOUNTER — Other Ambulatory Visit: Payer: Self-pay

## 2020-01-21 DIAGNOSIS — M6281 Muscle weakness (generalized): Secondary | ICD-10-CM

## 2020-01-21 DIAGNOSIS — M25562 Pain in left knee: Secondary | ICD-10-CM

## 2020-01-21 DIAGNOSIS — M25662 Stiffness of left knee, not elsewhere classified: Secondary | ICD-10-CM

## 2020-01-21 NOTE — Therapy (Signed)
[place] [facility] [address] [place], [address] Phone: [phone]   Fax:  [phone]  Physical Therapy Treatment/DC Summary  Reporting Period 11/05/19 - 01/21/20  Patient Details  Name: Traci Drake MRN: [id number] Date of Birth: [birth year] No data recorded  Encounter Date: 01/21/2020   PT End of Session - 01/21/20 1416    Visit Number 14    Number of Visits 17    Date for PT Re-Evaluation 01/22/20    PT Start Time 0145    PT Stop Time 0215    PT Time Calculation (min) 30 min    Activity Tolerance Patient tolerated treatment well;No increased pain    Behavior During Therapy WFL for tasks assessed/performed           Past Medical History:  Diagnosis Date  . Asthma    seasonal  . Complication of anesthesia    needed albumin in PACU. and had vertigo  . Decreased libido without sexual dysfunction   . Menopause     Past Surgical History:  Procedure Laterality Date  . BREAST BIOPSY  2003  . KNEE ARTHROPLASTY Right 11/2016   Partial   . KNEE ARTHROSCOPY Left    6-8 years ago  . KNEE CLOSED REDUCTION Left 11/02/2019   Procedure: CLOSED MANIPULATION KNEE;  Surgeon: Gaynelle Arabian, MD;  Location: [facility];  Service: Orthopedics;  Laterality: Left;  43mn  . TOTAL KNEE ARTHROPLASTY Left 12/08/2018   Procedure: TOTAL KNEE ARTHROPLASTY;  Surgeon: AGaynelle Arabian MD;  Location: [facility];  Service: Orthopedics;  Laterality: Left;    There were no vitals filed for this visit.     TherEx - Recumbant bike L8 L4 552ms PT reviewed the following HEP with patient with patient able to demonstrate a set of the following with min cuing for correction needed. PT educated patient on parameters of therex (how/when to inc/decrease intensity, frequency, rep/set range, stretch hold time, and purpose of therex) with verbalized understanding.  Access Code: [email or web address] Curl with Weight Machine - 1 x daily - 7 x weekly  - 3 sets - 5-10 reps Full Leg Press - 1 x daily - 7 x weekly - 3 sets - 5-10 reps Forward T with Weight - 1 x daily - 7 x weekly - 3 sets - 5-10 reps Goblet Squat with Kettlebell - 1 x daily - 7 x weekly - 3 sets - 5-10 reps Single Leg Bridge - 1 x daily - 7 x weekly - 3 sets - 5-10 reps Runner's Step Up/Down - 1 x daily - 2 x weekly - 3 sets - 5-10 reps                           PT Education - 01/21/20 1416    Education provided Yes    Education Details d/c recommendations/HEP    Person(s) Educated Patient    Methods Explanation;Demonstration;Verbal cues    Comprehension Verbalized understanding;Returned demonstration;Verbal cues required            PT Short Term Goals - 01/21/20 1423      PT SHORT TERM GOAL #1   Title Pt will be independent with HEP in order to decrease ankle pain and increase strength in order to improve pain-free function at home and work.    Baseline 01/21/20 HEP updated 11/05/19 HEP given    Time 4    Period Weeks    Status  Achieved      PT SHORT TERM GOAL #2   Title Patient will demonstrate PROM WNL in order to demonstrate adequate joint motion for ADLs    Baseline 01/21/20 PROM WNL 11/05/19 0d - 119d    Time 4    Period Weeks    Status Achieved             PT Long Term Goals - 01/21/20 1353      PT LONG TERM GOAL #1   Title Patient will increase FOTO score to 67 to demonstrate predicted increase in functional mobility to complete ADLs    Baseline 11/05/19 72 01/21/20 60    Time 8    Period Weeks    Status Achieved      PT LONG TERM GOAL #2   Title Patient will demonstrate 1RM of L hamstring curl of 35# to demonstrate symmetry of RLE and increased knee flex strength    Baseline 11/05/19 35# 11/05/19 25#    Time 8    Period Weeks    Status Achieved      PT LONG TERM GOAL #3   Title Patient will demonstrate knee AROM WNL in order to complete yard work and stair negotiation    Baseline 01/21/20 0-121d; 11/05/19 -4d - 105d     Time 8    Period Weeks    Status Achieved      PT LONG TERM GOAL #5   Status Revised                 Plan - 01/21/20 1418    Clinical Impression Statement PT re-assessed goals this session where patient has met all goals to safely d/c PT at this time. Patient is able to demonstrate and verbalize understanding of all d/c recommendations and HEP. Pt given clinic contact info should any questions/concerns arise.    Personal Factors and Comorbidities Age;Comorbidity 1;Past/Current Experience;Sex;Time since onset of injury/illness/exacerbation    Comorbidities OA, previous sx on L knee    Examination-Activity Limitations Bathing;Squat;Lift;Stairs;Bed Mobility;Bend;Locomotion Level;Stand;Carry;Transfers;Sit;Sleep    Examination-Participation Restrictions Cleaning;Laundry;Community Activity;Driving    Stability/Clinical Decision Making Evolving/Moderate complexity    Clinical Decision Making Moderate    Rehab Potential Good    Clinical Impairments Affecting Rehab Potential Positive: motivation, age, partial replacement, active; Negative: history of L knee problems    PT Frequency 2x / week    PT Duration 8 weeks    PT Treatment/Interventions Aquatic Therapy;Electrical Stimulation;Cryotherapy;Iontophoresis 77m/ml Dexamethasone;Moist Heat;DME Instruction;Functional mobility training;Therapeutic activities;Neuromuscular re-education;Dry needling;Passive range of motion;Joint Manipulations;Taping;Manual techniques;Therapeutic exercise;Gait training;Stair training;Patient/family education    PT Next Visit Plan Progress SL activities, deep squats, closed chain hamstring strengthening    PT Home Exercise Plan SL bridge, lunge, knee flex stretch, hip flexor stretch    Consulted and Agree with Plan of Care Patient           Patient will benefit from skilled therapeutic intervention in order to improve the following deficits and impairments:  Abnormal gait, Decreased activity tolerance, Decreased  coordination, Decreased strength, Increased fascial restricitons, Impaired flexibility, Pain, Postural dysfunction, Improper body mechanics, Decreased range of motion, Difficulty walking, Decreased endurance, Decreased mobility, Increased muscle spasms  Visit Diagnosis: Muscle weakness (generalized)  Chronic pain of left knee  Stiffness of left knee, not elsewhere classified     Problem List Patient Active Problem List   Diagnosis Date Noted  . Arthrofibrosis of total knee replacement ([facility] 12/08/2018  . Asthma, exercise induced 09/20/2014  . Overweight 09/20/2014   CDurwin Reges  DPT Durwin Reges 01/21/2020, 2:25 PM  [place] [facility] [address], [place], [address] Phone: [phone]   Fax:  [phone]  Name: DORIANNA MCKIVER MRN: [id number] Date of Birth: 12/21/1955

## 2020-01-25 ENCOUNTER — Ambulatory Visit: Admitting: Physical Therapy

## 2020-01-27 ENCOUNTER — Encounter: Admitting: Physical Therapy

## 2020-01-28 ENCOUNTER — Ambulatory Visit: Admitting: Physical Therapy

## 2020-02-01 ENCOUNTER — Ambulatory Visit: Admitting: Physical Therapy

## 2020-02-04 ENCOUNTER — Encounter: Admitting: Physical Therapy

## 2020-02-08 ENCOUNTER — Encounter: Admitting: Physical Therapy

## 2020-02-11 ENCOUNTER — Encounter: Admitting: Physical Therapy

## 2020-02-15 ENCOUNTER — Encounter: Admitting: Physical Therapy

## 2020-11-15 ENCOUNTER — Other Ambulatory Visit: Payer: Self-pay

## 2020-11-15 ENCOUNTER — Ambulatory Visit (LOCAL_COMMUNITY_HEALTH_CENTER): Payer: Self-pay

## 2020-11-15 DIAGNOSIS — Z111 Encounter for screening for respiratory tuberculosis: Secondary | ICD-10-CM

## 2020-11-18 ENCOUNTER — Other Ambulatory Visit: Payer: Self-pay

## 2020-11-18 ENCOUNTER — Ambulatory Visit (LOCAL_COMMUNITY_HEALTH_CENTER): Payer: Self-pay

## 2020-11-18 DIAGNOSIS — Z111 Encounter for screening for respiratory tuberculosis: Secondary | ICD-10-CM

## 2020-11-18 LAB — TB SKIN TEST
Induration: 0 mm
TB Skin Test: NEGATIVE

## 2021-04-13 ENCOUNTER — Other Ambulatory Visit: Payer: Self-pay | Admitting: Psychiatry

## 2021-04-13 ENCOUNTER — Ambulatory Visit
Admission: RE | Admit: 2021-04-13 | Discharge: 2021-04-13 | Disposition: A | Discharge status: Home / Self Care | Source: Ambulatory Visit | Attending: Psychiatry | Admitting: Psychiatry

## 2021-04-13 ENCOUNTER — Ambulatory Visit
Admission: RE | Admit: 2021-04-13 | Discharge: 2021-04-13 | Disposition: A | Discharge status: Home / Self Care | Attending: Psychiatry | Admitting: Psychiatry

## 2021-04-13 DIAGNOSIS — R059 Cough, unspecified: Secondary | ICD-10-CM | POA: Insufficient documentation

## 2021-04-13 DIAGNOSIS — R0789 Other chest pain: Secondary | ICD-10-CM | POA: Insufficient documentation

## 2021-09-06 ENCOUNTER — Other Ambulatory Visit: Payer: Self-pay | Admitting: Psychiatry

## 2021-09-06 DIAGNOSIS — R053 Chronic cough: Secondary | ICD-10-CM

## 2021-09-07 ENCOUNTER — Ambulatory Visit
Admission: RE | Admit: 2021-09-07 | Discharge: 2021-09-07 | Disposition: A | Discharge status: Home / Self Care | Source: Ambulatory Visit | Attending: Psychiatry | Admitting: Psychiatry

## 2021-09-07 DIAGNOSIS — R053 Chronic cough: Secondary | ICD-10-CM | POA: Diagnosis not present

## 2022-04-23 ENCOUNTER — Ambulatory Visit
Admission: RE | Admit: 2022-04-23 | Discharge: 2022-04-23 | Disposition: A | Discharge status: Home / Self Care | Source: Ambulatory Visit | Attending: Orthopedic Surgery | Admitting: Orthopedic Surgery

## 2022-04-23 ENCOUNTER — Other Ambulatory Visit: Payer: Self-pay | Admitting: Orthopedic Surgery

## 2022-04-23 DIAGNOSIS — R2242 Localized swelling, mass and lump, left lower limb: Secondary | ICD-10-CM | POA: Insufficient documentation

## 2022-04-26 ENCOUNTER — Other Ambulatory Visit: Payer: Self-pay | Admitting: Orthopedic Surgery

## 2022-04-26 DIAGNOSIS — Z96652 Presence of left artificial knee joint: Secondary | ICD-10-CM

## 2022-05-09 ENCOUNTER — Encounter
Admission: RE | Admit: 2022-05-09 | Discharge: 2022-05-09 | Disposition: A | Discharge status: Home / Self Care | Source: Ambulatory Visit | Attending: Orthopedic Surgery | Admitting: Orthopedic Surgery

## 2022-05-09 DIAGNOSIS — Z96652 Presence of left artificial knee joint: Secondary | ICD-10-CM | POA: Diagnosis present

## 2022-05-09 MED ORDER — TECHNETIUM TC 99M MEDRONATE IV KIT
20.0000 | PACK | Freq: Once | INTRAVENOUS | Status: AC | PRN
  Administered 2022-05-09: 21.99 via INTRAVENOUS

## 2023-05-14 ENCOUNTER — Other Ambulatory Visit: Payer: Self-pay | Admitting: Surgery

## 2023-05-14 DIAGNOSIS — Z96652 Presence of left artificial knee joint: Secondary | ICD-10-CM

## 2023-05-14 DIAGNOSIS — G8929 Other chronic pain: Secondary | ICD-10-CM

## 2023-05-15 ENCOUNTER — Ambulatory Visit
Admission: RE | Admit: 2023-05-15 | Discharge: 2023-05-15 | Disposition: A | Discharge status: Home / Self Care | Payer: Medicare Other | Source: Ambulatory Visit | Attending: Surgery | Admitting: Surgery

## 2023-05-15 DIAGNOSIS — Z96652 Presence of left artificial knee joint: Secondary | ICD-10-CM | POA: Diagnosis present

## 2023-05-15 DIAGNOSIS — M25562 Pain in left knee: Secondary | ICD-10-CM | POA: Insufficient documentation

## 2023-05-15 DIAGNOSIS — G8929 Other chronic pain: Secondary | ICD-10-CM | POA: Insufficient documentation

## 2023-10-04 IMAGING — CR DG CHEST 2V
1 series · 2 of 2 positions shown · non-contrast
Comparison: None.

CLINICAL DATA: Cough, chest tightness

EXAM:
CHEST - 2 VIEW

[Series 1: dg chest 2 view · 0.14mm/px · 2 of 2 slices shown]
[im 1/2]
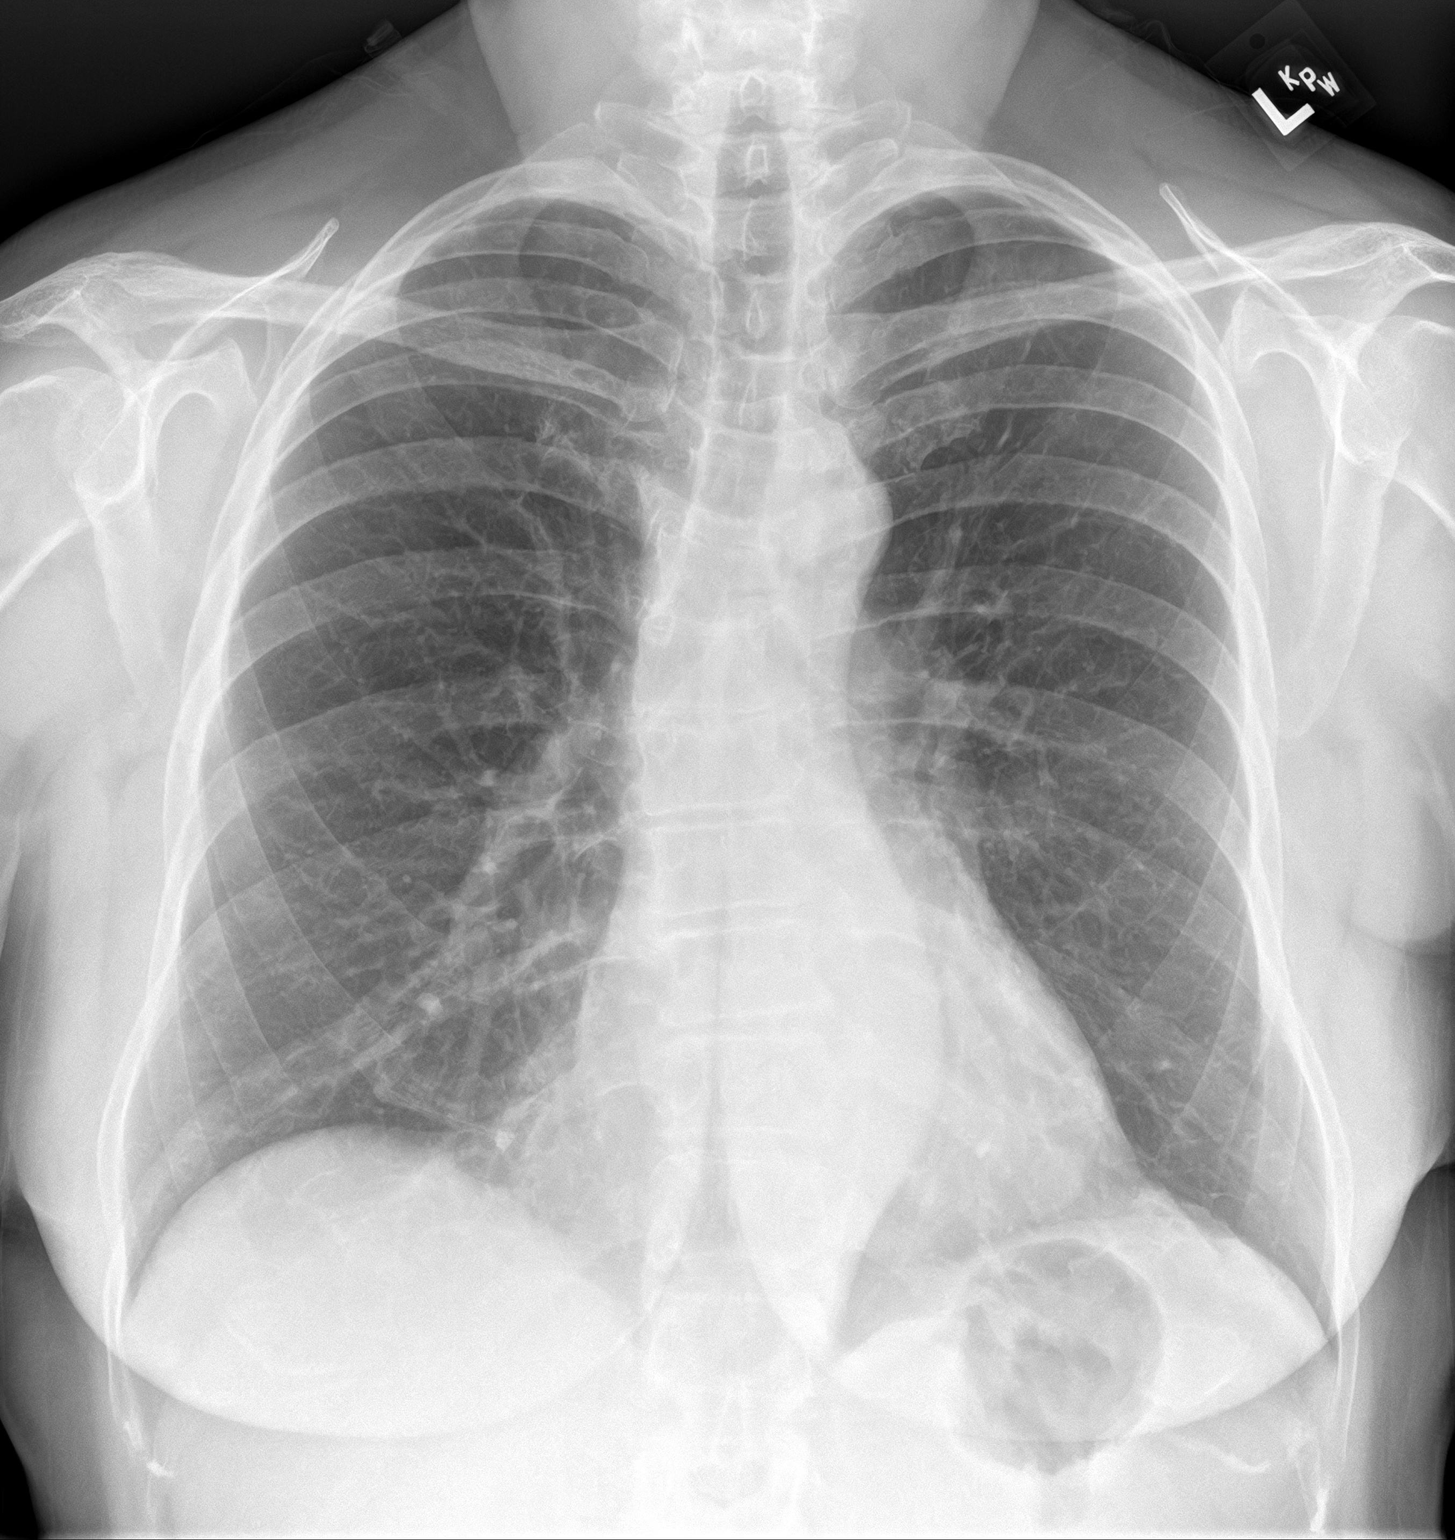
[im 2/2]
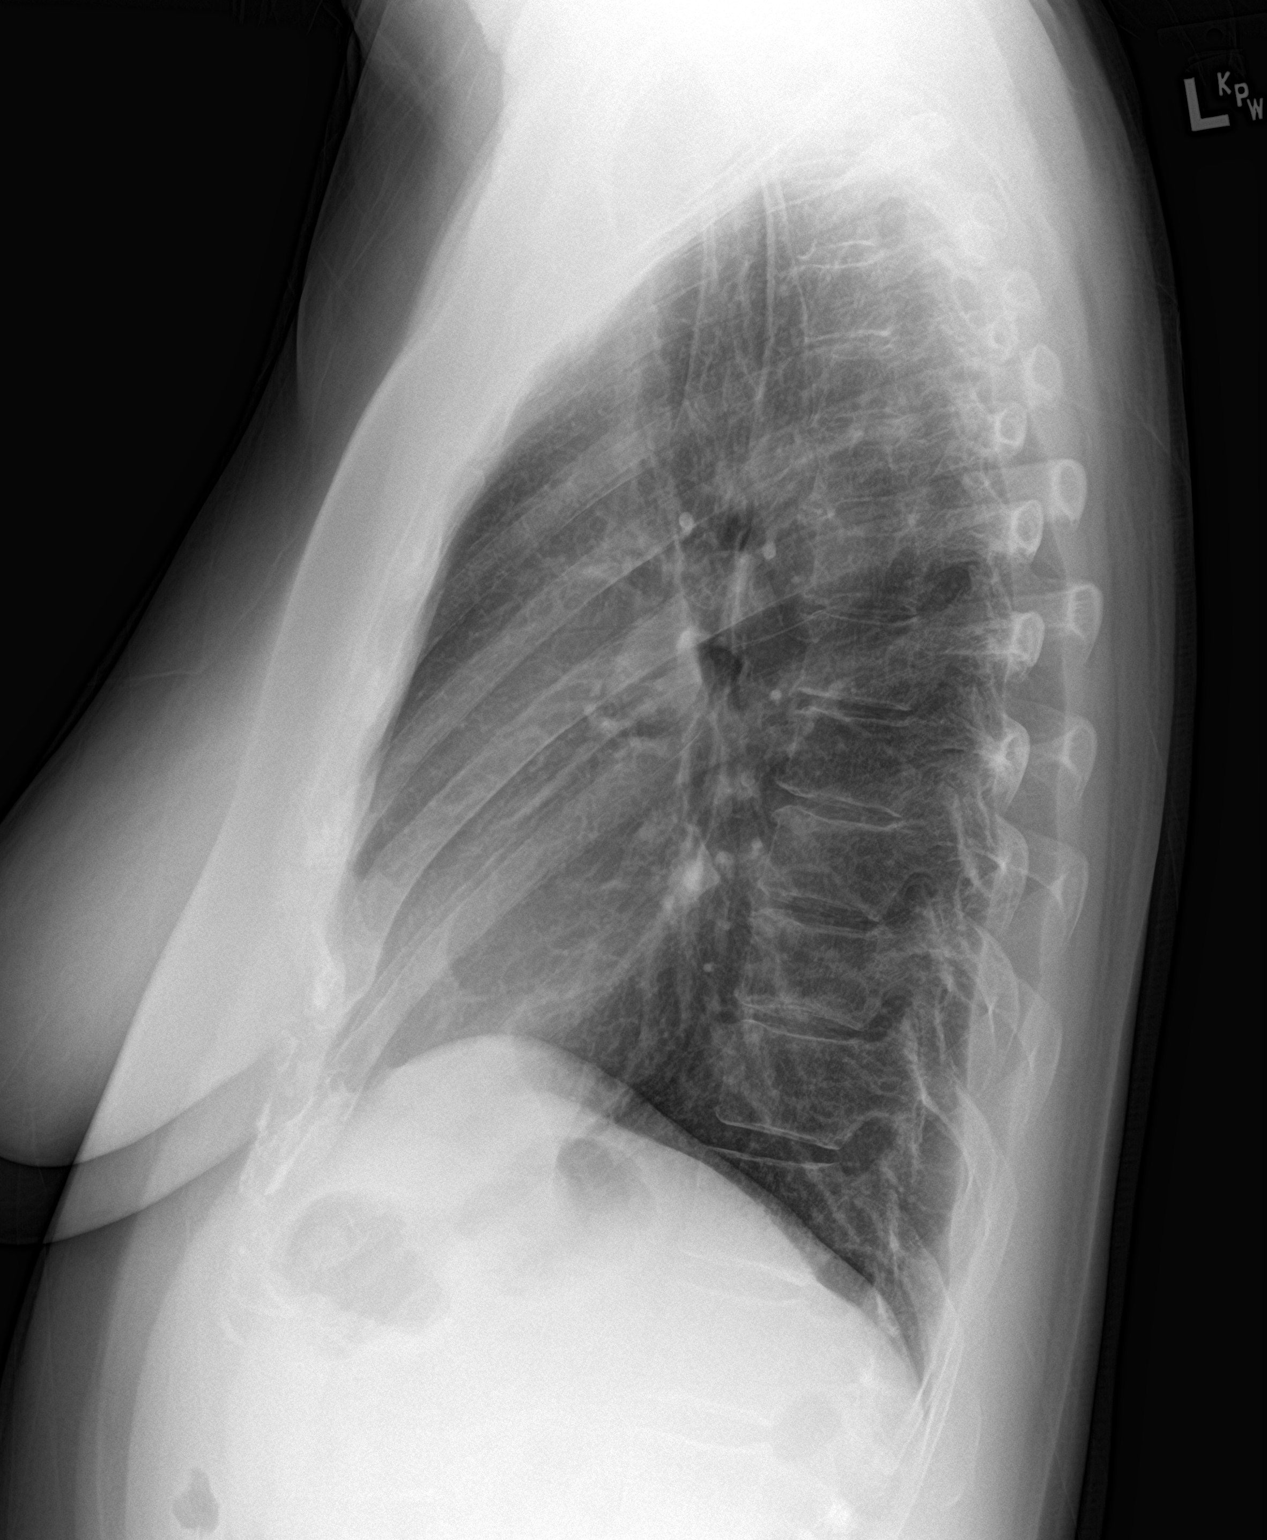

[2 of 2 positions shown; findings below may reference images not displayed]

FINDINGS: The heart size and mediastinal contours are within normal limits.
Both lungs are clear. The visualized skeletal structures are
unremarkable.
IMPRESSION: No acute abnormality of the lungs.
